# Patient Record
Sex: Female | Born: 1955 | Race: White | Hispanic: No | Marital: Single | Smoking: Current every day smoker
Health system: Southern US, Community
[De-identification: ages and names within clinical notes are randomized; demographics above are authoritative.]

## PROBLEM LIST (undated history)

## (undated) DIAGNOSIS — F329 Major depressive disorder, single episode, unspecified: Secondary | ICD-10-CM

## (undated) DIAGNOSIS — I10 Essential (primary) hypertension: Secondary | ICD-10-CM

## (undated) DIAGNOSIS — M545 Low back pain, unspecified: Secondary | ICD-10-CM

## (undated) DIAGNOSIS — K589 Irritable bowel syndrome without diarrhea: Secondary | ICD-10-CM

## (undated) DIAGNOSIS — K219 Gastro-esophageal reflux disease without esophagitis: Secondary | ICD-10-CM

## (undated) DIAGNOSIS — E785 Hyperlipidemia, unspecified: Secondary | ICD-10-CM

## (undated) DIAGNOSIS — M542 Cervicalgia: Secondary | ICD-10-CM

## (undated) DIAGNOSIS — G8929 Other chronic pain: Secondary | ICD-10-CM

## (undated) DIAGNOSIS — G43909 Migraine, unspecified, not intractable, without status migrainosus: Secondary | ICD-10-CM

## (undated) DIAGNOSIS — F419 Anxiety disorder, unspecified: Secondary | ICD-10-CM

## (undated) DIAGNOSIS — F319 Bipolar disorder, unspecified: Secondary | ICD-10-CM

## (undated) DIAGNOSIS — F32A Depression, unspecified: Secondary | ICD-10-CM

## (undated) HISTORY — DX: Gastro-esophageal reflux disease without esophagitis: K21.9

## (undated) HISTORY — PX: TOTAL ABDOMINAL HYSTERECTOMY W/ BILATERAL SALPINGOOPHORECTOMY: SHX83

## (undated) HISTORY — DX: Low back pain, unspecified: M54.50

## (undated) HISTORY — PX: ABDOMINAL HYSTERECTOMY: SHX81

## (undated) HISTORY — DX: Irritable bowel syndrome, unspecified: K58.9

## (undated) HISTORY — DX: Hyperlipidemia, unspecified: E78.5

## (undated) HISTORY — DX: Other chronic pain: G89.29

## (undated) HISTORY — DX: Anxiety disorder, unspecified: F41.9

## (undated) HISTORY — DX: Cervicalgia: M54.2

## (undated) HISTORY — PX: OTHER SURGICAL HISTORY: SHX169

## (undated) HISTORY — DX: Major depressive disorder, single episode, unspecified: F32.9

## (undated) HISTORY — DX: Depression, unspecified: F32.A

## (undated) HISTORY — DX: Essential (primary) hypertension: I10

## (undated) HISTORY — PX: SHOULDER SURGERY: SHX246

## (undated) HISTORY — DX: Low back pain: M54.5

## (undated) HISTORY — PX: BACK SURGERY: SHX140

## (undated) HISTORY — DX: Migraine, unspecified, not intractable, without status migrainosus: G43.909

## (undated) HISTORY — DX: Bipolar disorder, unspecified: F31.9

---

## 1998-05-22 ENCOUNTER — Emergency Department (HOSPITAL_COMMUNITY): Admission: EM | Admit: 1998-05-22 | Discharge: 1998-05-22 | Payer: Self-pay | Admitting: Emergency Medicine

## 1998-08-21 HISTORY — PX: CHOLECYSTECTOMY: SHX55

## 1999-08-22 HISTORY — PX: OTHER SURGICAL HISTORY: SHX169

## 2000-01-13 ENCOUNTER — Encounter: Payer: Self-pay | Admitting: Neurosurgery

## 2000-01-18 ENCOUNTER — Inpatient Hospital Stay (HOSPITAL_COMMUNITY): Admission: RE | Admit: 2000-01-18 | Discharge: 2000-01-19 | Payer: Self-pay | Admitting: Neurosurgery

## 2000-01-18 ENCOUNTER — Encounter: Payer: Self-pay | Admitting: Neurosurgery

## 2000-03-20 ENCOUNTER — Encounter: Admission: RE | Admit: 2000-03-20 | Discharge: 2000-03-20 | Payer: Self-pay | Admitting: Neurosurgery

## 2000-03-20 ENCOUNTER — Encounter: Payer: Self-pay | Admitting: Neurosurgery

## 2000-05-25 ENCOUNTER — Encounter: Admission: RE | Admit: 2000-05-25 | Discharge: 2000-05-25 | Payer: Self-pay | Admitting: Neurosurgery

## 2000-05-25 ENCOUNTER — Encounter: Payer: Self-pay | Admitting: Neurosurgery

## 2001-05-09 ENCOUNTER — Encounter: Payer: Self-pay | Admitting: Emergency Medicine

## 2001-05-09 ENCOUNTER — Emergency Department (HOSPITAL_COMMUNITY): Admission: EM | Admit: 2001-05-09 | Discharge: 2001-05-10 | Payer: Self-pay | Admitting: Emergency Medicine

## 2001-08-02 ENCOUNTER — Emergency Department (HOSPITAL_COMMUNITY): Admission: EM | Admit: 2001-08-02 | Discharge: 2001-08-03 | Payer: Self-pay | Admitting: Emergency Medicine

## 2001-08-02 ENCOUNTER — Emergency Department (HOSPITAL_COMMUNITY): Admission: EM | Admit: 2001-08-02 | Discharge: 2001-08-02 | Payer: Self-pay | Admitting: Emergency Medicine

## 2001-08-03 ENCOUNTER — Encounter: Payer: Self-pay | Admitting: Emergency Medicine

## 2001-08-10 ENCOUNTER — Emergency Department (HOSPITAL_COMMUNITY): Admission: EM | Admit: 2001-08-10 | Discharge: 2001-08-11 | Payer: Self-pay | Admitting: *Deleted

## 2001-09-04 ENCOUNTER — Emergency Department (HOSPITAL_COMMUNITY): Admission: EM | Admit: 2001-09-04 | Discharge: 2001-09-04 | Payer: Self-pay | Admitting: Emergency Medicine

## 2001-09-23 ENCOUNTER — Emergency Department (HOSPITAL_COMMUNITY): Admission: EM | Admit: 2001-09-23 | Discharge: 2001-09-23 | Payer: Self-pay | Admitting: *Deleted

## 2001-11-27 ENCOUNTER — Encounter: Payer: Self-pay | Admitting: Internal Medicine

## 2001-11-27 ENCOUNTER — Ambulatory Visit (HOSPITAL_COMMUNITY): Admission: RE | Admit: 2001-11-27 | Discharge: 2001-11-27 | Payer: Self-pay | Admitting: Internal Medicine

## 2002-06-26 ENCOUNTER — Ambulatory Visit (HOSPITAL_COMMUNITY): Admission: RE | Admit: 2002-06-26 | Discharge: 2002-06-26 | Payer: Self-pay | Admitting: Internal Medicine

## 2002-06-26 ENCOUNTER — Encounter: Payer: Self-pay | Admitting: Internal Medicine

## 2003-01-29 ENCOUNTER — Encounter: Payer: Self-pay | Admitting: *Deleted

## 2003-01-29 ENCOUNTER — Emergency Department (HOSPITAL_COMMUNITY): Admission: EM | Admit: 2003-01-29 | Discharge: 2003-01-29 | Payer: Self-pay | Admitting: *Deleted

## 2003-03-07 ENCOUNTER — Encounter: Payer: Self-pay | Admitting: *Deleted

## 2003-03-07 ENCOUNTER — Emergency Department (HOSPITAL_COMMUNITY): Admission: EM | Admit: 2003-03-07 | Discharge: 2003-03-07 | Payer: Self-pay | Admitting: *Deleted

## 2003-03-08 ENCOUNTER — Ambulatory Visit (HOSPITAL_COMMUNITY): Admission: RE | Admit: 2003-03-08 | Discharge: 2003-03-08 | Payer: Self-pay | Admitting: Emergency Medicine

## 2003-03-08 ENCOUNTER — Encounter: Payer: Self-pay | Admitting: Emergency Medicine

## 2003-03-16 ENCOUNTER — Emergency Department (HOSPITAL_COMMUNITY): Admission: EM | Admit: 2003-03-16 | Discharge: 2003-03-16 | Payer: Self-pay | Admitting: Emergency Medicine

## 2003-03-16 ENCOUNTER — Encounter: Payer: Self-pay | Admitting: *Deleted

## 2003-03-18 ENCOUNTER — Encounter: Payer: Self-pay | Admitting: Orthopaedic Surgery

## 2003-03-18 ENCOUNTER — Ambulatory Visit (HOSPITAL_COMMUNITY): Admission: RE | Admit: 2003-03-18 | Discharge: 2003-03-18 | Payer: Self-pay | Admitting: Orthopaedic Surgery

## 2003-05-12 ENCOUNTER — Ambulatory Visit (HOSPITAL_COMMUNITY): Admission: RE | Admit: 2003-05-12 | Discharge: 2003-05-12 | Payer: Self-pay | Admitting: Orthopaedic Surgery

## 2003-05-12 ENCOUNTER — Encounter: Payer: Self-pay | Admitting: Orthopaedic Surgery

## 2003-06-18 ENCOUNTER — Emergency Department (HOSPITAL_COMMUNITY): Admission: EM | Admit: 2003-06-18 | Discharge: 2003-06-18 | Payer: Self-pay | Admitting: *Deleted

## 2003-08-13 ENCOUNTER — Emergency Department (HOSPITAL_COMMUNITY): Admission: EM | Admit: 2003-08-13 | Discharge: 2003-08-13 | Payer: Self-pay | Admitting: Emergency Medicine

## 2004-02-03 ENCOUNTER — Emergency Department (HOSPITAL_COMMUNITY): Admission: EM | Admit: 2004-02-03 | Discharge: 2004-02-03 | Payer: Self-pay | Admitting: Emergency Medicine

## 2004-02-22 ENCOUNTER — Emergency Department (HOSPITAL_COMMUNITY): Admission: EM | Admit: 2004-02-22 | Discharge: 2004-02-22 | Payer: Self-pay | Admitting: *Deleted

## 2005-05-15 ENCOUNTER — Emergency Department (HOSPITAL_COMMUNITY): Admission: EM | Admit: 2005-05-15 | Discharge: 2005-05-15 | Payer: Self-pay | Admitting: Emergency Medicine

## 2006-02-14 ENCOUNTER — Emergency Department (HOSPITAL_COMMUNITY): Admission: EM | Admit: 2006-02-14 | Discharge: 2006-02-14 | Payer: Self-pay | Admitting: Emergency Medicine

## 2006-06-22 ENCOUNTER — Emergency Department (HOSPITAL_COMMUNITY): Admission: EM | Admit: 2006-06-22 | Discharge: 2006-06-22 | Payer: Self-pay | Admitting: Emergency Medicine

## 2007-03-01 ENCOUNTER — Encounter (INDEPENDENT_AMBULATORY_CARE_PROVIDER_SITE_OTHER): Payer: Self-pay | Admitting: Internal Medicine

## 2007-03-01 ENCOUNTER — Emergency Department (HOSPITAL_COMMUNITY): Admission: EM | Admit: 2007-03-01 | Discharge: 2007-03-01 | Payer: Self-pay | Admitting: Emergency Medicine

## 2007-03-13 ENCOUNTER — Ambulatory Visit: Payer: Self-pay | Admitting: Internal Medicine

## 2007-03-13 DIAGNOSIS — I10 Essential (primary) hypertension: Secondary | ICD-10-CM | POA: Insufficient documentation

## 2007-03-13 DIAGNOSIS — E785 Hyperlipidemia, unspecified: Secondary | ICD-10-CM

## 2007-03-13 DIAGNOSIS — F319 Bipolar disorder, unspecified: Secondary | ICD-10-CM | POA: Insufficient documentation

## 2007-03-13 DIAGNOSIS — F411 Generalized anxiety disorder: Secondary | ICD-10-CM | POA: Insufficient documentation

## 2007-03-13 DIAGNOSIS — F329 Major depressive disorder, single episode, unspecified: Secondary | ICD-10-CM

## 2007-03-13 DIAGNOSIS — M545 Low back pain: Secondary | ICD-10-CM

## 2007-03-13 DIAGNOSIS — K589 Irritable bowel syndrome without diarrhea: Secondary | ICD-10-CM | POA: Insufficient documentation

## 2007-03-13 DIAGNOSIS — K5900 Constipation, unspecified: Secondary | ICD-10-CM | POA: Insufficient documentation

## 2007-03-13 DIAGNOSIS — K219 Gastro-esophageal reflux disease without esophagitis: Secondary | ICD-10-CM

## 2007-03-13 DIAGNOSIS — M542 Cervicalgia: Secondary | ICD-10-CM | POA: Insufficient documentation

## 2007-03-13 DIAGNOSIS — Z87898 Personal history of other specified conditions: Secondary | ICD-10-CM | POA: Insufficient documentation

## 2007-09-06 ENCOUNTER — Ambulatory Visit: Payer: Self-pay | Admitting: Internal Medicine

## 2007-09-07 ENCOUNTER — Encounter (INDEPENDENT_AMBULATORY_CARE_PROVIDER_SITE_OTHER): Payer: Self-pay | Admitting: Internal Medicine

## 2007-09-09 ENCOUNTER — Telehealth (INDEPENDENT_AMBULATORY_CARE_PROVIDER_SITE_OTHER): Payer: Self-pay | Admitting: *Deleted

## 2007-09-09 LAB — CONVERTED CEMR LAB
BUN: 6 mg/dL (ref 6–23)
CO2: 21 meq/L (ref 19–32)
Calcium: 9.5 mg/dL (ref 8.4–10.5)
Chloride: 100 meq/L (ref 96–112)
Creatinine, Ser: 0.64 mg/dL (ref 0.40–1.20)
Sodium: 138 meq/L (ref 135–145)

## 2007-09-12 ENCOUNTER — Telehealth (INDEPENDENT_AMBULATORY_CARE_PROVIDER_SITE_OTHER): Payer: Self-pay | Admitting: Internal Medicine

## 2007-09-13 ENCOUNTER — Ambulatory Visit (HOSPITAL_COMMUNITY): Admission: RE | Admit: 2007-09-13 | Discharge: 2007-09-13 | Payer: Self-pay | Admitting: Internal Medicine

## 2007-09-13 ENCOUNTER — Encounter (INDEPENDENT_AMBULATORY_CARE_PROVIDER_SITE_OTHER): Payer: Self-pay | Admitting: Internal Medicine

## 2007-09-16 ENCOUNTER — Encounter (INDEPENDENT_AMBULATORY_CARE_PROVIDER_SITE_OTHER): Payer: Self-pay | Admitting: Internal Medicine

## 2007-09-25 ENCOUNTER — Ambulatory Visit: Payer: Self-pay | Admitting: Internal Medicine

## 2007-11-21 ENCOUNTER — Ambulatory Visit (HOSPITAL_COMMUNITY): Admission: RE | Admit: 2007-11-21 | Discharge: 2007-11-21 | Payer: Self-pay | Admitting: Neurosurgery

## 2007-12-18 ENCOUNTER — Ambulatory Visit (HOSPITAL_BASED_OUTPATIENT_CLINIC_OR_DEPARTMENT_OTHER): Admission: RE | Admit: 2007-12-18 | Discharge: 2007-12-18 | Payer: Self-pay | Admitting: Orthopedic Surgery

## 2008-08-04 ENCOUNTER — Emergency Department (HOSPITAL_COMMUNITY): Admission: EM | Admit: 2008-08-04 | Discharge: 2008-08-04 | Payer: Self-pay | Admitting: Emergency Medicine

## 2008-11-18 ENCOUNTER — Emergency Department (HOSPITAL_COMMUNITY): Admission: EM | Admit: 2008-11-18 | Discharge: 2008-11-18 | Payer: Self-pay | Admitting: Emergency Medicine

## 2009-01-31 ENCOUNTER — Emergency Department (HOSPITAL_COMMUNITY): Admission: EM | Admit: 2009-01-31 | Discharge: 2009-01-31 | Payer: Self-pay | Admitting: Emergency Medicine

## 2009-02-16 ENCOUNTER — Emergency Department (HOSPITAL_COMMUNITY): Admission: EM | Admit: 2009-02-16 | Discharge: 2009-02-16 | Payer: Self-pay | Admitting: Emergency Medicine

## 2009-05-22 ENCOUNTER — Emergency Department (HOSPITAL_COMMUNITY): Admission: EM | Admit: 2009-05-22 | Discharge: 2009-05-22 | Payer: Self-pay | Admitting: Emergency Medicine

## 2009-07-18 ENCOUNTER — Emergency Department (HOSPITAL_COMMUNITY): Admission: EM | Admit: 2009-07-18 | Discharge: 2009-07-18 | Payer: Self-pay | Admitting: Emergency Medicine

## 2009-11-08 ENCOUNTER — Emergency Department (HOSPITAL_COMMUNITY): Admission: EM | Admit: 2009-11-08 | Discharge: 2009-11-08 | Payer: Self-pay | Admitting: Emergency Medicine

## 2009-11-13 ENCOUNTER — Emergency Department (HOSPITAL_COMMUNITY): Admission: EM | Admit: 2009-11-13 | Discharge: 2009-11-13 | Payer: Self-pay | Admitting: Emergency Medicine

## 2009-11-14 ENCOUNTER — Emergency Department (HOSPITAL_COMMUNITY): Admission: EM | Admit: 2009-11-14 | Discharge: 2009-11-14 | Payer: Self-pay | Admitting: Emergency Medicine

## 2009-11-14 ENCOUNTER — Encounter: Payer: Self-pay | Admitting: Orthopedic Surgery

## 2009-11-29 ENCOUNTER — Emergency Department (HOSPITAL_COMMUNITY): Admission: EM | Admit: 2009-11-29 | Discharge: 2009-11-29 | Payer: Self-pay | Admitting: Emergency Medicine

## 2009-12-10 ENCOUNTER — Emergency Department (HOSPITAL_COMMUNITY)
Admission: EM | Admit: 2009-12-10 | Discharge: 2009-12-10 | Payer: Self-pay | Source: Home / Self Care | Admitting: Emergency Medicine

## 2009-12-27 ENCOUNTER — Emergency Department (HOSPITAL_COMMUNITY): Admission: EM | Admit: 2009-12-27 | Discharge: 2009-12-27 | Payer: Self-pay | Admitting: Emergency Medicine

## 2010-01-07 ENCOUNTER — Ambulatory Visit: Payer: Self-pay | Admitting: Physician Assistant

## 2010-01-07 ENCOUNTER — Telehealth (INDEPENDENT_AMBULATORY_CARE_PROVIDER_SITE_OTHER): Payer: Self-pay | Admitting: *Deleted

## 2010-01-07 DIAGNOSIS — M25519 Pain in unspecified shoulder: Secondary | ICD-10-CM | POA: Insufficient documentation

## 2010-01-09 ENCOUNTER — Encounter: Payer: Self-pay | Admitting: Physician Assistant

## 2010-01-11 ENCOUNTER — Encounter: Payer: Self-pay | Admitting: Physician Assistant

## 2010-01-11 LAB — CONVERTED CEMR LAB
Albumin: 4.3 g/dL (ref 3.5–5.2)
Alkaline Phosphatase: 65 units/L (ref 39–117)
BUN: 4 mg/dL — ABNORMAL LOW (ref 6–23)
CO2: 25 meq/L (ref 19–32)
Chloride: 104 meq/L (ref 96–112)
Eosinophils Relative: 1 % (ref 0–5)
HCT: 42.9 % (ref 36.0–46.0)
MCV: 99.1 fL (ref 78.0–100.0)
Monocytes Absolute: 0.6 10*3/uL (ref 0.1–1.0)
Monocytes Relative: 6 % (ref 3–12)
Neutrophils Relative %: 65 % (ref 43–77)
Platelets: 166 10*3/uL (ref 150–400)
Potassium: 4.6 meq/L (ref 3.5–5.3)
RBC: 4.33 M/uL (ref 3.87–5.11)
Sodium: 138 meq/L (ref 135–145)
TSH: 0.497 microintl units/mL (ref 0.350–4.500)
Total Protein: 7.1 g/dL (ref 6.0–8.3)
WBC: 9.2 10*3/uL (ref 4.0–10.5)

## 2010-03-09 ENCOUNTER — Emergency Department (HOSPITAL_COMMUNITY): Admission: EM | Admit: 2010-03-09 | Discharge: 2010-03-09 | Payer: Self-pay | Admitting: Emergency Medicine

## 2010-03-18 ENCOUNTER — Encounter: Payer: Self-pay | Admitting: Orthopedic Surgery

## 2010-03-18 ENCOUNTER — Ambulatory Visit (HOSPITAL_COMMUNITY): Admission: RE | Admit: 2010-03-18 | Discharge: 2010-03-18 | Payer: Self-pay | Admitting: Family Medicine

## 2010-04-06 ENCOUNTER — Emergency Department (HOSPITAL_COMMUNITY): Admission: EM | Admit: 2010-04-06 | Discharge: 2010-04-06 | Payer: Self-pay | Admitting: Emergency Medicine

## 2010-07-07 ENCOUNTER — Emergency Department (HOSPITAL_COMMUNITY): Admission: EM | Admit: 2010-07-07 | Discharge: 2010-07-07 | Payer: Self-pay | Admitting: Emergency Medicine

## 2010-08-27 ENCOUNTER — Encounter: Payer: Self-pay | Admitting: Orthopedic Surgery

## 2010-09-07 ENCOUNTER — Ambulatory Visit
Admission: RE | Admit: 2010-09-07 | Discharge: 2010-09-07 | Payer: Self-pay | Source: Home / Self Care | Attending: Orthopedic Surgery | Admitting: Orthopedic Surgery

## 2010-09-07 ENCOUNTER — Encounter: Payer: Self-pay | Admitting: Orthopedic Surgery

## 2010-09-07 DIAGNOSIS — IMO0002 Reserved for concepts with insufficient information to code with codable children: Secondary | ICD-10-CM | POA: Insufficient documentation

## 2010-09-08 ENCOUNTER — Encounter: Payer: Self-pay | Admitting: Orthopedic Surgery

## 2010-09-15 ENCOUNTER — Telehealth: Payer: Self-pay | Admitting: *Deleted

## 2010-09-16 ENCOUNTER — Encounter (INDEPENDENT_AMBULATORY_CARE_PROVIDER_SITE_OTHER): Payer: Self-pay | Admitting: *Deleted

## 2010-09-19 ENCOUNTER — Encounter
Admission: RE | Admit: 2010-09-19 | Discharge: 2010-09-19 | Payer: Self-pay | Source: Home / Self Care | Attending: Orthopedic Surgery | Admitting: Orthopedic Surgery

## 2010-09-20 NOTE — Progress Notes (Signed)
Summary: Files Request  Phone Note Outgoing Call   Summary of Call: Please request records from [name] in [place] and Dr. Fuller Canada in [place]. Initial call taken by: Brynda Rim,  Jan 07, 2010 1:36 PM  Follow-up for Phone Call        shelia, can you handle this for me Follow-up by: Armenia Shannon,  Jan 12, 2010 11:09 AM

## 2010-09-20 NOTE — Letter (Signed)
Summary: PT INFORMATION SHEET  PT INFORMATION SHEET   Imported By: Arta Bruce 02/28/2010 09:25:17  _____________________________________________________________________  External Attachment:    Type:   Image     Comment:   External Document

## 2010-09-20 NOTE — Letter (Signed)
Summary: *HSN Results Follow up  [facility]  [address] [place], [place] [address]   Phone: [phone]  Fax: [phone]      01/09/2010   WILLONA PHARISS [address] [place], [place]  [address]   Dear  Ms. Ernestene Kiel,                            ____S.Drinkard,FNP   ____D. Gore,FNP       ____B. McPherson,MD   ____V. Rankins,MD    ____E. Mulberry,MD    ____N. Daphine Deutscher, FNP  ____D. Reche Dixon, MD    ____K. Philipp Deputy, MD    __x__S. Alben Spittle, PA-C     This letter is to inform you that your recent test(s):  _______Pap Smear    __x_____Lab Test     _______X-ray    ___x____ is within acceptable limits  _______ requires a medication change  _______ requires a follow-up lab visit  _______ requires a follow-up visit with your provider   Comments:       _________________________________________________________ If you have any questions, please contact our office                     Sincerely,  [name] PA-C [facility]

## 2010-09-20 NOTE — Assessment & Plan Note (Signed)
Summary: new pt/ first est care/ back/shoulder/neck/knee and hands pai...   Vital Signs:  Patient profile:   55 year old female Height:      65 inches Weight:      125 pounds Temp:     97.8 degrees F oral Pulse rate:   68 / minute Pulse rhythm:   regular Resp:     18 per minute BP sitting:   157 / 110  (left arm)  Vitals Entered By: Armenia Shannon (Jan 07, 2010 12:00 PM) CC: experiencing headache (migraines), patient needs MRI concerning pain Is Patient Diabetic? No Pain Assessment Patient in pain? no       Does patient need assistance? Functional Status Self care Ambulation Normal   Primary Care Provider:  [name], PA-C  CC:  experiencing headache (migraines) and patient needs MRI concerning pain.  History of Present Illness: Here as new patient.  States last seen by pain clinic in [place].  Had trouble getting to appointments and stopped.  Also used to see [name] in [place].  Did not admit to seeing [name] in [place] until asked.  States she has left shoulder surgery 1-2 years ago.  Does not remember a lot about it.  When [place] searched, there is no record.  Comes into office with complaints of pain in multiple sites. Reviewed records from [name], whom she saw previously.  According to the notes, the patient had a hard time relaying her hx at that time as well.  There are multiple notations in the records that information was not confirmed when prior physician's offices were contacted.  There is an indication that she was discharged from [name] practice due to breaching a pain contract.  She mainly focuses on shoulder pain.  She states she had surgery done on the left and has continued to have pain. She now notes pain in the right shoulder x 6-12 mos.  She denies any injury.  She notes pain all the time.  She has not seen anyone or done anything for this.    Allergies (verified): 1)  ! Darvocet  Past History:  Past Medical  History: Anxiety Depression   a.  previously took paxil in past - no meds in years   b.  used to see psychiatry GERD Hyperlipidemia Hypertension Low back pain chronic neck pain IBS with constipation migraines bipolar disorder  Past Surgical History: Cholecystectomy-2000 C5-6 fusion 2001 - [name] states she had left shoulder surgery - can't remember when and no record in [facility] Hysterectomy (TAH/BSO)  Family History: father-deceased Mother - deceased (lung cancer) child-31 child-28  Social History: Divorced Current Smoker-1ppd x30 years Alcohol use-yes-7-9 drinks, 2-4 times per month   a. denies current use Drug use-no Disability - back pain, headaches  Review of Systems      See HPI General:  Denies chills and fever. CV:  Denies chest pain or discomfort and shortness of breath with exertion. Resp:  Denies cough. GI:  Denies bloody stools. Neuro:  Complains of headaches. Psych:  Denies suicidal thoughts/plans.  Physical Exam  General:  alert, well-developed, and well-nourished.   Head:  normocephalic and atraumatic.   Neck:  supple.   Lungs:  normal breath sounds and no wheezes.   Heart:  normal rate, regular rhythm, and no murmur.   Abdomen:  soft, non-tender, and no inguinal hernia.   Msk:  right shoulder: + pain over AC joint + pain with ext and int rotation + pain with empty  can test no deformity or effusion noted  left shoulder: no pain over [facility] joint + pain with ext and int rotation + pain with empty can test no deformity noted  of note, patient would not relax arms to allow passive examination no crepitus noted bilat  Neurologic:  alert & oriented X3 and cranial nerves II-XII intact.   Psych:  normally interactive.     Impression & Recommendations:  Problem # 1:  SHOULDER PAIN, BILATERAL (ICD-719.41)  right > left history is somewhat suspect she does not know who operated on her shoulder and she does not know what pain clinic  she went to she does have 3 x shaped scars on her shoulder that could be from a laparoscopic procedure I will need the name of her surgeon and records before doing much more also, need records from her pain clinic  she keeps requesting MRIs of her knee, hip, shoulder, back, etc.  her exam is limited as she does not allow passive motion she reacts in pain to every test or palpation she does have fairly good ROM  get xrays tx with naproxen may send to PT  after patient left office, was able to find MRI on left shoulder done in 2009 by [name]  it did show some rotator cuff tendinopathy and poss ant and post labral tears she may have had surgery with [name]  of note, did do search on  controlled substances website she has received 3 rx for narcotics this year from the ED at [facility]  review of Debera Lat demonstrates many trips to the ED at [facility] and [facility] she told the ED provider in March that she was to see [name] in [place] to f/u on her left shoulder pain  The following medications were removed from the medication list:    Diclofenac Sodium 75 Mg Tbec (Diclofenac sodium) .Marland Kitchen... 1 by mouth two times a day    Skelaxin 800 Mg Tabs (Metaxalone) .Marland Kitchen... 1 by mouth three times a day Her updated medication list for this problem includes:    Naproxen 500 Mg Tabs (Naproxen) .Marland Kitchen... Take 1 tablet by mouth two times a day with food  as needed for pain  Orders: T-Drug Screen-Urine, (single) [phone]) Diagnostic X-Ray/Fluoroscopy (Diagnostic X-Ray/Flu)  Problem # 2:  NECK PAIN, CHRONIC (ICD-723.1)  MRI in 2009 without significant abnormalities  The following medications were removed from the medication list:    Diclofenac Sodium 75 Mg Tbec (Diclofenac sodium) .Marland Kitchen... 1 by mouth two times a day    Skelaxin 800 Mg Tabs (Metaxalone) .Marland Kitchen... 1 by mouth three times a day Her updated medication list for this problem includes:    Naproxen 500 Mg Tabs (Naproxen) .Marland Kitchen... Take 1 tablet  by mouth two times a day with food  as needed for pain  Orders: T-Drug Screen-Urine, (single) [phone])  Problem # 3:  HYPERTENSION (ICD-401.9)  needs tx start Norvasc 5  Orders: T-Comprehensive Metabolic Panel ([id number]) T-CBC w/Diff ([id number]) T-TSH ([id number])  Her updated medication list for this problem includes:    Amlodipine Besylate 5 Mg Tabs (Amlodipine besylate) .Marland Kitchen... Take 1 tablet by mouth once a day for blood pressure  Problem # 4:  DEPRESSION (ICD-311) I suspect there is more of this contributing here than anything else will eventually try to get her to agree to see LCSW consider PHQ9 at next visit  Problem # 5:  Preventive Health Care (ICD-V70.0)  check some baseline labs and UDS  Orders: T-Comprehensive Metabolic Panel (  [phone]) T-CBC w/Diff [id number]) T-TSH ([phone]) T-Drug Screen-Urine, (single) ([id number])  Complete Medication List: 1)  Amlodipine Besylate 5 Mg Tabs (Amlodipine besylate) .... Take 1 tablet by mouth once a day for blood pressure 2)  Naproxen 500 Mg Tabs (Naproxen) .... Take 1 tablet by mouth two times a day with food  as needed for pain  Patient Instructions: 1)  Take the naproxen two times a day with food as needed for pain. 2)  You can take tylenol in addition to the naproxen if you have pain that breaks through. 3)  Take 650 - 1000 mg of tylenol every 4-6 hours as needed for relief of pain or discomfort of fever. Avoid taking more than 4000 mg in a 24 hour period( can cause liver damage in higher doses).  4)  Start taking Amlodipine for blood pressure. 5)  Please schedule a follow-up appointment in 1 month with Florie Carico for Blood Pressure. 6)  Tobacco is very bad for your health and your loved ones ! You should stop smoking !  7)  Stop smoking tips: Choose a quit date. Cut down before the quit date. Decide what you will do as a substitute when you feel the urge to smoke(gum, toothpick, exercise).  8)  Sign form to  get records from Dr. Lyman Bishop Fusco's office in [place].  9)  Need name of surgeon and pain clinic so we can request records from there as well. Prescriptions: NAPROXEN 500 MG TABS (NAPROXEN) Take 1 tablet by mouth two times a day with food  as needed for pain  #60 x 2   Entered and Authorized by:   [name] PA-C   Signed by:   [name] PA-C on 01/07/2010   Method used:   Print then Give to Patient   RxID:   [id number] AMLODIPINE BESYLATE 5 MG TABS (AMLODIPINE BESYLATE) Take 1 tablet by mouth once a day for blood pressure  #30 x 2   Entered and Authorized by:   [name] PA-C   Signed by:   [name] PA-C on 01/07/2010   Method used:   Print then Give to Patient   RxID:   [id number]    MRI EXAM  Procedure date:  11/21/2007  Findings:      Exam Type: left shoulder MRI Results: IMPRESSION:    1.  Glenohumeral joint degenerative changes.   2.  No significant findings for bony impingement.   3.  Moderate rotator cuff tendinopathy/tendinosis.  Areas of bursal   surface irregularity are noted without discrete tear.   4.  Anterior and posterior labral tears are suspected the superior   labrum is intact.   5.  Intact biceps tendon.

## 2010-09-20 NOTE — Letter (Signed)
Summary: *HSN Results Follow up  [facility]  [address] [place], [place] [address]   Phone: [phone]  Fax: [phone]      01/11/2010   Deanna Clark [address] [place], [place]  [address]   Dear  Ms. Deanna Clark,                            ____S.Drinkard,FNP   ____D. Gore,FNP       ____B. McPherson,MD   ____V. Rankins,MD    ____E. Mulberry,MD    ____N. Daphine Deutscher, FNP  ____D. Reche Dixon, MD    ____K. Philipp Deputy, MD    __x__S. Alben Spittle, PA-C     This letter is to inform you that your recent test(s):  _______Pap Smear    __x_____Lab Test     _______X-ray    ____x___ is within acceptable limits  _______ requires a medication change  ____x___ requires a follow-up lab visit  _______ requires a follow-up visit with your provider   Comments: Urine test needs to be collected again.  Please call to reschedule.       _________________________________________________________ If you have any questions, please contact our office                     Sincerely,  [name] PA-C [facility]

## 2010-09-22 NOTE — Assessment & Plan Note (Signed)
Summary: left knee pain/needs xray/medicaid/frs   Vital Signs:  Patient profile:   55 year old female Height:      65 inches Weight:      135 pounds Pulse rate:   60 / minute Resp:     16 per minute  Vitals Entered By: Fuller Canada MD (September 07, 2010 2:54 PM)  Visit Type:  new patient Referring Provider:  [facility] in [place] Primary Provider:  [facility] in [place]  CC:  left knee pain.  History of Present Illness:   55 year old female referred from [facility], telling us that she fell onto both her knees on December 4.  She took some Flexeril and naproxen as well as 4 mg of ibuprofen and did not improve.  She has a history of being discharged from a local practice for lack of compliance with the pain medication contractor.  She complains of diffuse LEFT knee pain which is sharp constant worse at night and has an intensity of 10 out of 10  Meds: Flexeril and Naproxen for the knee as well as Ibuprofen 400mg  qid.     Allergies: 1)  ! 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 Cardiovascular:  Denies chest pain, palpitations, fainting, and murmurs. Respiratory:  Denies short of breath, wheezing, couch, tightness, pain on inspiration, and snoring . Gastrointestinal:  Complains of constipation; denies heartburn, nausea, vomiting, diarrhea, and blood in your stools. Genitourinary:  Denies frequency, urgency, difficulty urinating, painful urination, flank pain, and bleeding in urine. Neurologic:  Complains of numbness; denies tingling, unsteady gait, dizziness, tremors, and seizure. Musculoskeletal:  Complains of stiffness and redness; denies joint pain, swelling, instability, heat, and muscle pain. Endocrine:  Denies excessive thirst,  exessive urination, and heat or cold intolerance. Psychiatric:  Complains of depression; denies nervousness, anxiety, and hallucinations. Skin:  Denies changes in the skin, poor healing, rash, itching, and redness. HEENT:  Denies blurred or double vision, eye pain, redness, and watering. Immunology:  Denies seasonal allergies, sinus problems, and allergic to bee stings. Hemoatologic:  Complains of brusing; denies easy bleeding.  Physical Exam  Additional Exam:  small frame stable vital signs no deformities  Normal pulse and perfusion bilateral lower extremities  Small laceration closed old LEFT lateral knee.  Skin otherwise intact without rash  Normal sensation both lower extremities  Psychiatric exam she is awake and alert she is oriented x3 her mood is anxious.  She is obviously exacerbating her symptoms.  She ambulated with an altered gait.  She has an abrasion over the RIGHT knee with full passive range of motion.  Strength is normal.  Her knee is stable.  LEFT knee is difficult to examine she didn't want to bend the knee until I told her that I would not be able to see her or examine her without bend She had giving way weakness.  Once I did give her any upper flexion at 90 she was noted to have no joint effusion but diffuse tenderness globally about the knee.  No swelling is seen.  The knee seems stable but the exam was inconclusive an unreliable.  Muscle tone was normal   Impression & Recommendations:  Problem # 1:  TEAR MEDIAL MENISCUS (ICD-836.0)  This is most likely an attempt to get medication however since my exam is unreliable based on her  guarding and posturing recommend MRI to rule out medial meniscal tear.  Most likely patellar contusion at most.  I will call her with the results if they're normal then she can continue with the hinged knee brace until her pain goes away.  It if it is abnormal then I would treat it.  Orders: New Patient Level III ([address]) Knee  x-ray,  3 views ([address])  Patient Instructions: 1)  NO FOLLOW UP NEEDED  2)  GET BRACE FROM [facility]  3)  MRI LEFT KNEE (call results)   Orders Added: 1)  New Patient Level III [09811] 2)  Knee x-ray,  3 views [73562]

## 2010-09-22 NOTE — Miscellaneous (Signed)
Summary: mri appt [facility] for 09/19/10 6:45pm  Clinical Lists Changes    lmom for pt about mri , dr h to call with results.

## 2010-09-22 NOTE — Letter (Signed)
Summary: History Form   History Form   Imported By: Eugenio Hoes 09/13/2010 09:57:39  _____________________________________________________________________  External Attachment:    Type:   Image     Comment:   External Document

## 2010-09-22 NOTE — Letter (Signed)
Summary: *Orthopedic Consult Note  [facility]  [address] [place] [address]  [place], [place] [address]   Phone: [phone]  Fax: [phone]    Re:    Deanna Clark DOB:    1956-01-30   Dear: Dr. Gwinda Passe   Thank you for requesting that we see the above patient for consultation.  A copy of the detailed office note will be sent under separate cover, for your review.  Evaluation today is consistent with: Feigning injury but we will rule out meniscal tear with MRI 1)  TEAR MEDIAL MENISCUS (ICD-836.0)   Our recommendation is for: MRI, Knee brace        Thank you for this opportunity to look after your patient.  Sincerely,   Terrance Mass. MD.

## 2010-09-22 NOTE — Letter (Signed)
Summary: Generic Letter for jury duty   [facility]  [address] [place] [address]  [place], [place] [address]   Phone: [phone]  Fax: [phone]     09/08/2010    Deanna Clark  DOB August 08, 2056   Dear [name] and To Whom This May Concern:  Due to medical reasons and current medical treatment, the above named patient will be unable to perform jury duty as requested per attached letter, for start date of September 12, 2010.     Sincerely,    Terrance Mass, MD

## 2010-11-09 NOTE — Telephone Encounter (Signed)
Advised patient

## 2011-01-06 NOTE — Op Note (Signed)
[place]  Patient:    NOGA, FOGG                     MRN: [id number] Proc. Date: 01/18/00 Adm. Date:  [id number] Disc. Date: [id number] Attending:  Barton Fanny CC:         Hewitt Shorts, M.D.                           Operative Report  PREOPERATIVE DIAGNOSIS:  C5-6 degenerative disk disease and spondylosis.  POSTOPERATIVE DIAGNOSIS:  C5-6 degenerative disk disease and spondylosis.  OPERATION PERFORMED:  C5-6 anterior cervical diskectomy and arthrodesis with iliac crest allograft and Synthes cervical plating.  SURGEON:  Hewitt Shorts, M.D.  ASSISTANT:  Payton Doughty, M.D.  ANESTHESIA:  General endotracheal.  INDICATIONS FOR PROCEDURE:  The patient is a 55 year old woman who presented with neck, shoulder and right upper extremity pain who was found to have significant degenerative disk disease and spondylosis at C5-6 level and decision was made to proceed with a single level anterior cervical diskectomy and arthrodesis.  DESCRIPTION OF PROCEDURE:  The patient was brought to the operating room and placed under general endotracheal anesthesia.  The patient was placed in 10 pounds of halter traction and the neck was prepped with Betadine soap and solution and draped in sterile fashion.  A horizontal incision was made in the left side of the neck.  The line of incision was infiltrated with local anesthetic with epinephrine.  The incison was made with a Shaw scalpel at a temperature of 120.  Dissection was carried down to the subcutaneous tissues and platysma.  Dissection was then carried out to an avascular plane leaving the sternocleidomastoids, carotid artery and jugular vein laterally and trachea and esophagus medially.  The ventral aspect of the vertebral column was identified and a localizing x-ray taken and the C5-6 intervertebral disk space identified.  Diskectomy was begun by incision of the annulus and continued  with a variety of microcurets and pituitary rongeurs.  The cartilaginous end plates of the corresponding vertebrae were removed using microcurets as well as the Midas Rex drill with an A-2 bur.  The microscope was draped and brought into the field to provide additional magnification, illumination and visualization.  The remainder of the procedure was performed using microdissection technique.  Posterior osteophytic overgrowth was removed using the Midas Rex drill with an A-2 bur and a 2 mm Kerrison punch with a thin foot plate.  The posterior longitudinal ligament was thickened and this was removed as well.  Foraminotomies were performed bilaterally and the posterior aspect of the uncinate processes were removed and good decompression was achieved of not only the thecal sac but of the nerve roots within the foramina bilaterally.  Once decompression was completed and hemostasis was established using Gelfoam soaked in thrombin, we proceeded with the arthrodesis.  We selected a wedge of iliac crest allograft.  It was cut and shaped using an oscillating saw and then graft was placed in the intervertebral disk space and countersunk.  We then removed anterior osteophytic overgrowth and selected a 16 mm Synthes plate.  It was secured with 4.0 x 14 mm cancellous self-drilling screws.  Each screw hole was initially started with an awl and then screw was used and secured.  They were placed in a fashion starting with opposite corners and then securing all four screws down tightly.  Once all  four screws were secured and the plate was lying flat against the vertebral column, locking screws were placed and an x-ray was taken.  The plate and screws were in good position, the graft was in good position.  The overall alignment was good.  We then irrigated the wound with bacitracin solution and checked for hemostasis which was established and confirmed and proceeded with closure.  The platysma was closed  with interrupted inverted 2-0 undyed Vicryl sutures, the subcutaneous and subcuticular layer were closed with interrupted inverted 3-0 undyed Vicryl sutures and the skin incisions were approximated with Dermabond.  The patient tolerated the procedure well.  Estimated blood loss for this procedure was 100 cc.  Sponge, needle and instrument counts were correct.  Following surgery, the patient was placed in a soft cervical collar to be reversed from the anesthetic, extubated and transferred to the recovery room for further care. DD:  01/18/00 TD:  01/20/00 Job: [id number]

## 2011-01-06 NOTE — H&P (Signed)
[facility]  Patient:    Deanna Clark, Deanna Clark                     MRN: [id number] Adm. Date:  [id number] Attending:  Barton Fanny                         History and Physical  HISTORY OF PRESENT ILLNESS:   The patient is a 55 year old right handed white female who was evaluated for degenerative changes in the cervical spine associated with pain and discomfort.  She has been having difficulty for many years and gradually worsening for at least the last five or more years.  She complains of pain in her neck which extends to the shoulders, bilaterally worse to the right than to the left, as well as is the upper back with discomfort and pain to the right arm and forearm.  She does not describe any specific weakness or any specific numbness or paresthesias.  The patient was studied with x-rays and MRI scan of the cervical and thoracic spine.  Cervical spine x-rays showed degenerative disc disease and spurring at C5-6 confirmed by MRI scan which shows degenerative disc disease and spondylosis at C5-6 level without significant degeneration at other levels. The thoracic studies did not show any significant degeneration.  The patient is admitted now for a single level C5-6 anterior cervical diskectomy and arthrodesis.  PAST MEDICAL HISTORY:  She denies any history of hypertension, myocardial infarction, cancer, stroke, diabetes, peptic ulcer disease or lung disease. She is status post hysterectomy and cholecystectomy.  ALLERGIES:  She currently has an allergy to codeine.  MEDICATIONS: Tylox, Vicodin and muscle relaxers.  FAMILY HISTORY:  Mother is age 5 and in good health.  Father died at age 33 with emphysema and heart disease.  SOCIAL HISTORY:  The patient does not work. She last worked 12 years ago.  She is really unable to explain why she does not work. She apparently is supported to some extent by her mother.  She is unmarried.  She does  have a son who lives locally.  She smokes a pack a day.  She has been smoking for 20 years. She drinks alcoholic beverages socially.  She denies history of substance abuse.  REVIEW OF SYSTEMS:  Notable for those difficulties described in her history of present illness and past medical history but is otherwise unremarkable.  PHYSICAL EXAMINATION:  GENERAL: The patient is a well-developed, well-nourished, white female who appears older than her stated age and on who one can smell a heavy odor of nicotine.  VITAL SIGNS:  Temperature 97.2, pulse 84, blood pressure 138/96, respiratory rate 20. Height 5 feet 5 inches, weight 125 pounds.  LUNGS:  Clear to auscultation.  She has symmetrical respiratory excursion.  HEART:  Regular rate and rhythm. Normal S1 and S2.  There is no murmur.  ABDOMEN: Soft and nontender.  Bowel sounds are present.  EXTREMITIES:  No clubbing, cyanosis or edema.  MUSCULOSKELETAL:  Shows some mild tenderness to palpation in the mid cervical region.  She has some mild discomfort on range of motion of the neck in all directions.  NEUROLOGIC:  Shows 5/5 strength of the upper extremities although she limits her exertion to some extent due to discomfort. Sensation is intact to pinprick through the upper extremities. Reflexes are 1 to 2 at the biceps, brachioradialis, triceps and quadriceps, 1 at the gastrocnemius and symmetrical bilaterally.  Toes  are downgoing bilaterally.  IMPRESSION:  Patient with chronic neck pain extending into the shoulders, upper back and into the right upper extremities secondary to degenerative disc disease and spondylosis at C5-6.  PLAN:  The patient will be admitted for C5-6 anterior cervical diskectomy and arthrodesis with Allograft and cervical plating.  We discussed alternatives of surgery, the nature of the surgical procedure itself, the uncertainty of clinical improvement with surgery and the significant risk of failure  with arthrodesis with her smoking history.  She was strongly counselled that if she would to proceed with surgery that it would be substantially advantageous to her quit smoking to improve the chance of a successful arthrodesis and successful outcome to surgery.  She has been explained the typical length of surgery, hospital stay and overall recuperation, need for postoperative immobilization in a soft cervical collar and the risks of surgery, including the risks of infection, bleeding, possible need for transfusion, the risk of nerve root dysfunction, pain, weakness, numbness or paresthesias, the risks of spinal cord dysfunction with paralysis of all four limbs, quadriplegia, the risks of failure of the arthrodesis particularly in the light of her significant smoking history and anesthetic risks of myocardial infarction, stroke, pneumonia and death, all of which are significantly increased due to her heavy smoking.  Understanding all of this, she does wish to proceed with surgery. She assured me that she would quit smoking to enhance the chance of a successful outcome.  It has been explained to her though that if she does develop a pseudoarthrosis and failed fusion that she will likely have chronic pain. DD:  01/18/00 TD:  01/18/00 Job: [id number]

## 2011-04-22 IMAGING — CR DG ELBOW COMPLETE 3+V*L*
4 series · 4 of 4 positions shown · non-contrast
Comparison: None.

CLINICAL DATA: Fall

LEFT ELBOW - COMPLETE 3+ VIEW

[x elbow joint ap left]
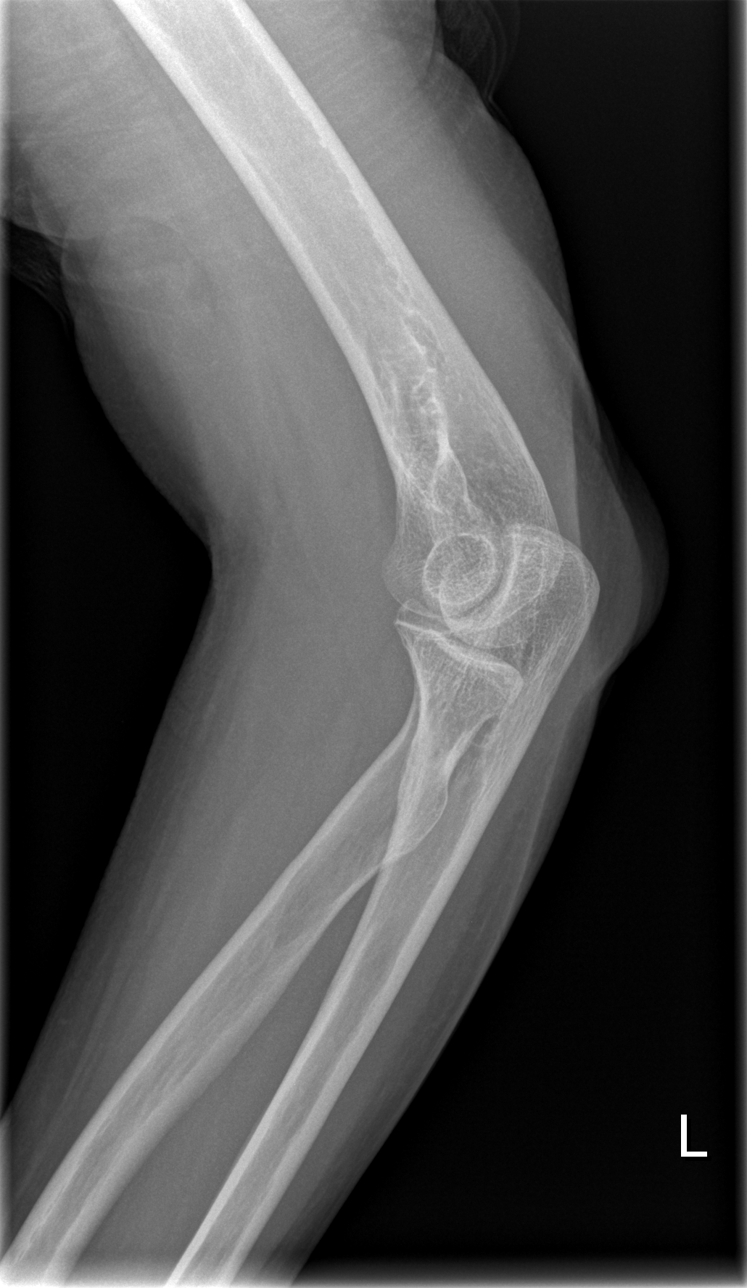

[x elbow joint obl. left (1 of 2)]
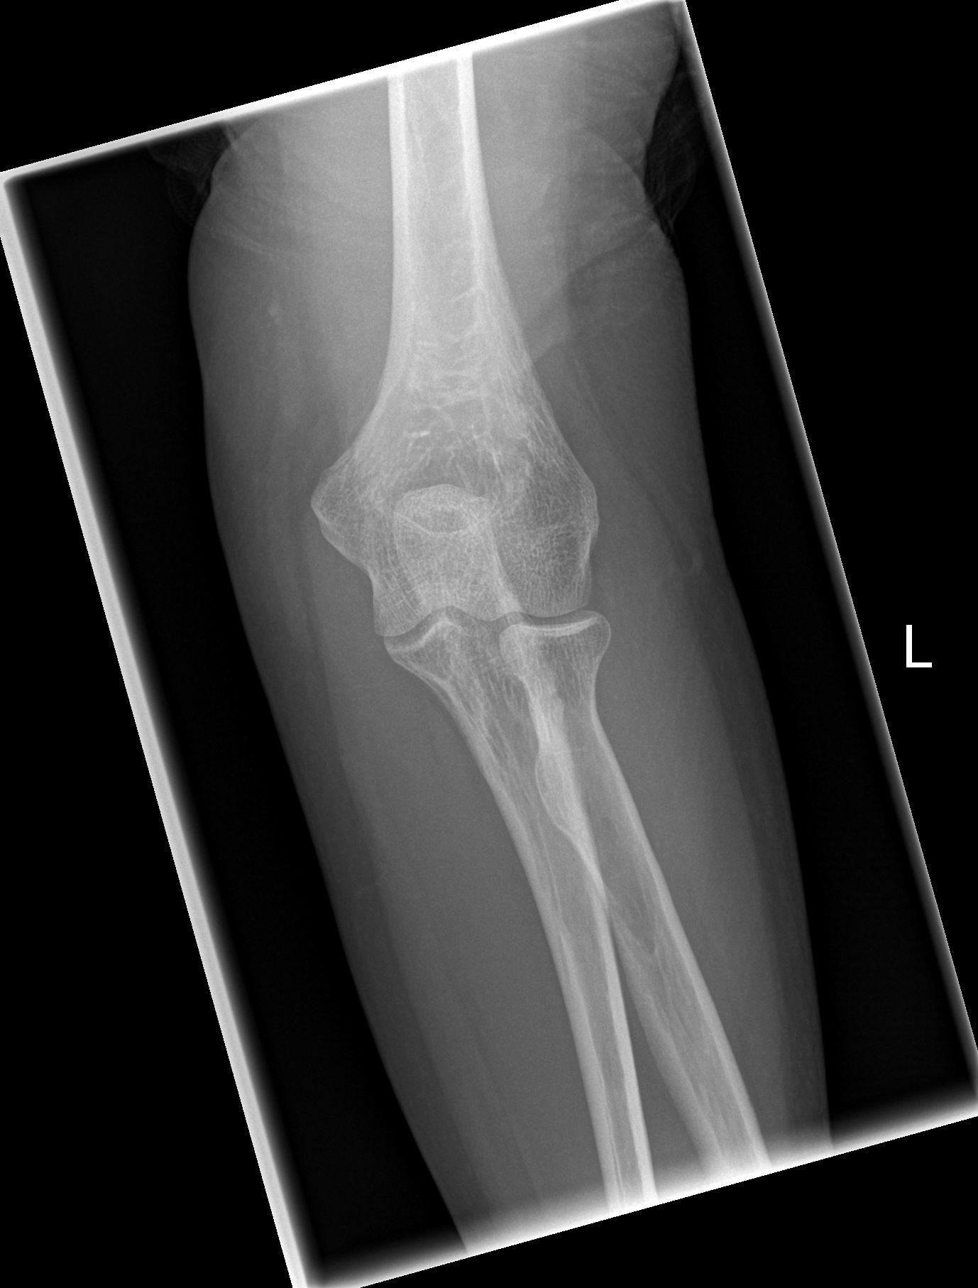

[x elbow joint obl. left (2 of 2)]
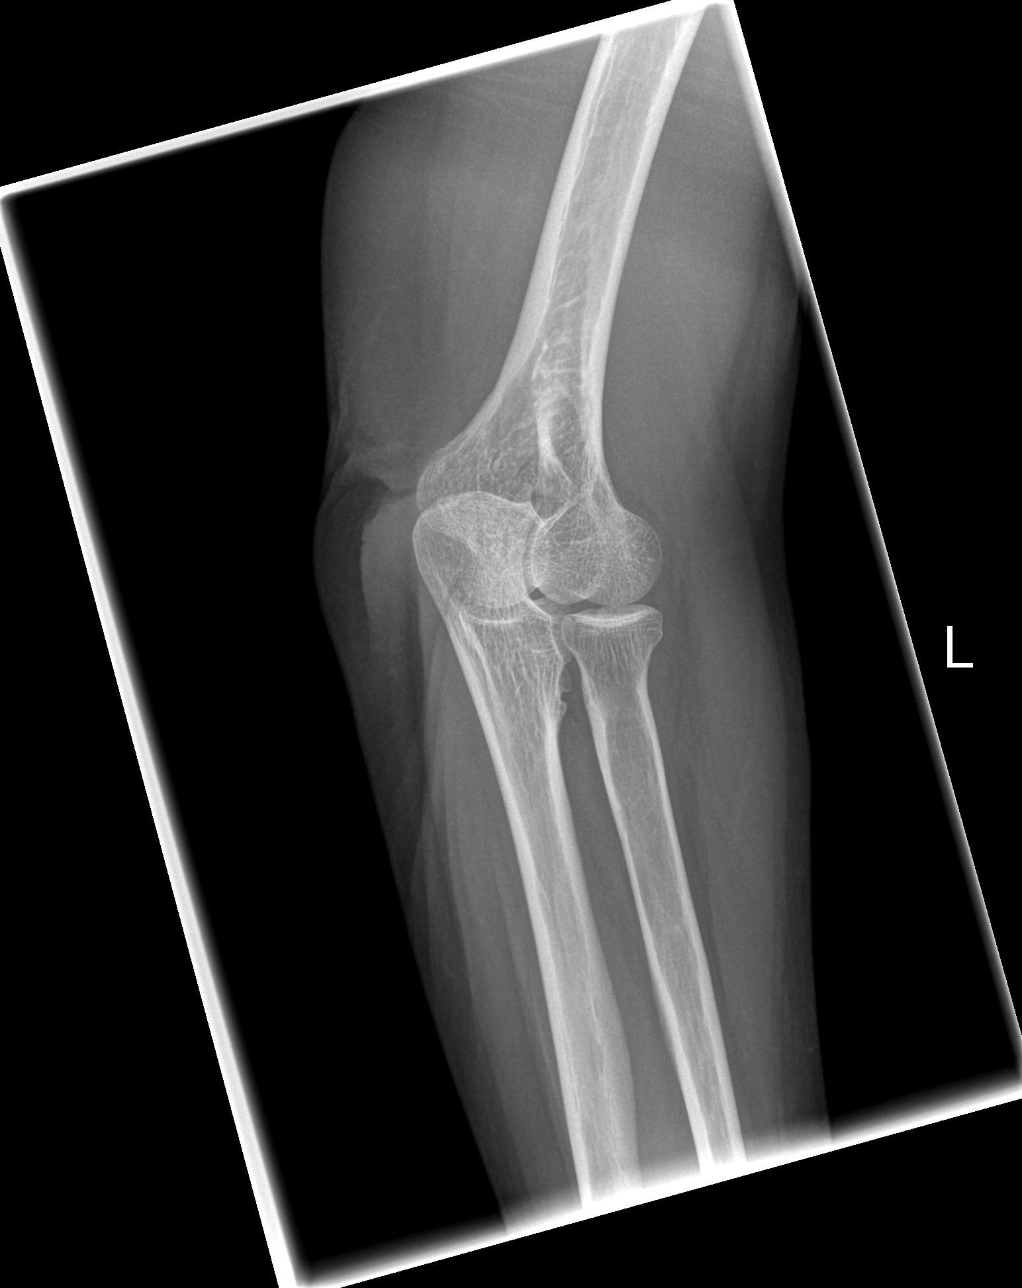

[x elbow joint lat left]
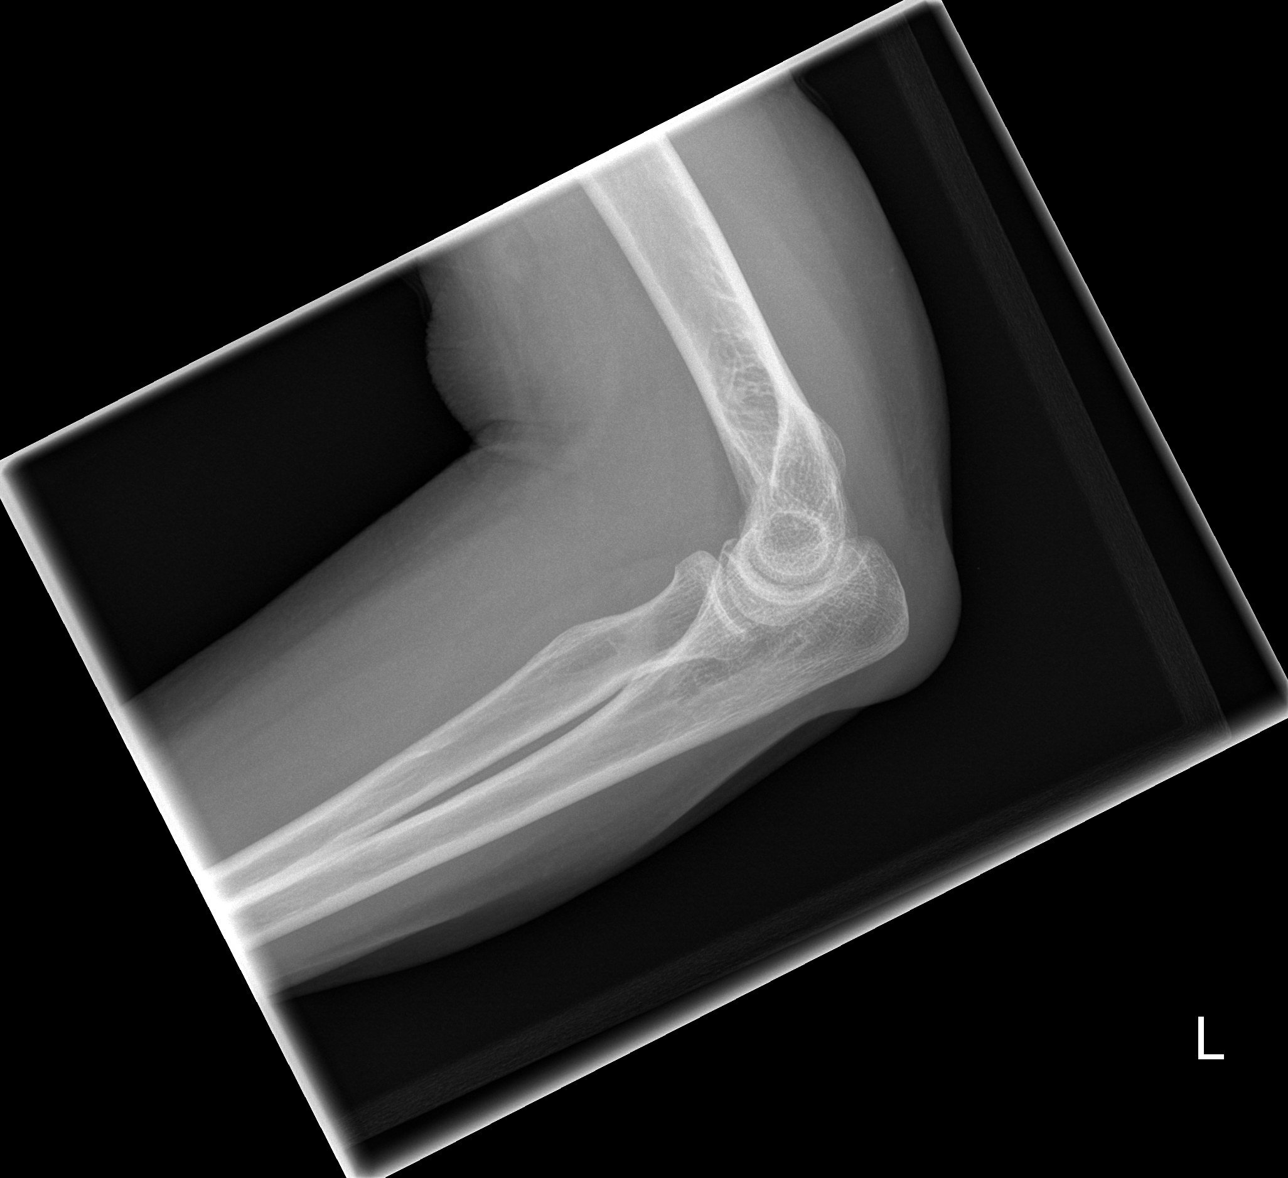

[4 of 4 positions shown; findings below may reference images not displayed]

FINDINGS: No acute fracture or dislocation.  Unremarkable soft
tissues.
IMPRESSION: No acute bony pathology.

## 2011-05-12 ENCOUNTER — Other Ambulatory Visit: Payer: Self-pay | Admitting: Orthopaedic Surgery

## 2011-05-12 DIAGNOSIS — M25511 Pain in right shoulder: Secondary | ICD-10-CM

## 2011-05-16 LAB — BASIC METABOLIC PANEL
BUN: 6
CO2: 26
Calcium: 9
Chloride: 105
GFR calc non Af Amer: >60
Potassium: 3.9
Sodium: 138

## 2011-05-18 ENCOUNTER — Ambulatory Visit
Admission: RE | Admit: 2011-05-18 | Discharge: 2011-05-18 | Disposition: A | Discharge status: Home / Self Care | Payer: Medicaid Other | Source: Ambulatory Visit | Attending: Orthopaedic Surgery | Admitting: Orthopaedic Surgery

## 2011-05-18 DIAGNOSIS — M25511 Pain in right shoulder: Secondary | ICD-10-CM

## 2011-06-06 LAB — BASIC METABOLIC PANEL
Chloride: 105
GFR calc Af Amer: >60
GFR calc non Af Amer: >60
Potassium: 4

## 2011-06-06 LAB — CBC
RBC: 4.44
WBC: 8.6

## 2011-06-06 LAB — DIFFERENTIAL
Lymphocytes Relative: 17
Lymphs Abs: 1.4
Monocytes Relative: 8
Neutro Abs: 6.3
Neutrophils Relative %: 74

## 2011-07-05 ENCOUNTER — Emergency Department (HOSPITAL_COMMUNITY)
Admission: EM | Admit: 2011-07-05 | Discharge: 2011-07-05 | Disposition: A | Discharge status: Home / Self Care | Payer: Medicaid Other | Attending: Emergency Medicine | Admitting: Emergency Medicine

## 2011-07-05 ENCOUNTER — Encounter (HOSPITAL_COMMUNITY): Payer: Self-pay | Admitting: *Deleted

## 2011-07-05 DIAGNOSIS — F17200 Nicotine dependence, unspecified, uncomplicated: Secondary | ICD-10-CM | POA: Insufficient documentation

## 2011-07-05 DIAGNOSIS — F319 Bipolar disorder, unspecified: Secondary | ICD-10-CM | POA: Insufficient documentation

## 2011-07-05 DIAGNOSIS — E785 Hyperlipidemia, unspecified: Secondary | ICD-10-CM | POA: Insufficient documentation

## 2011-07-05 DIAGNOSIS — M25552 Pain in left hip: Secondary | ICD-10-CM

## 2011-07-05 DIAGNOSIS — G8929 Other chronic pain: Secondary | ICD-10-CM | POA: Insufficient documentation

## 2011-07-05 DIAGNOSIS — M542 Cervicalgia: Secondary | ICD-10-CM | POA: Insufficient documentation

## 2011-07-05 DIAGNOSIS — Z9079 Acquired absence of other genital organ(s): Secondary | ICD-10-CM | POA: Insufficient documentation

## 2011-07-05 DIAGNOSIS — R51 Headache: Secondary | ICD-10-CM | POA: Insufficient documentation

## 2011-07-05 DIAGNOSIS — H9209 Otalgia, unspecified ear: Secondary | ICD-10-CM | POA: Insufficient documentation

## 2011-07-05 DIAGNOSIS — M25559 Pain in unspecified hip: Secondary | ICD-10-CM | POA: Insufficient documentation

## 2011-07-05 DIAGNOSIS — K219 Gastro-esophageal reflux disease without esophagitis: Secondary | ICD-10-CM | POA: Insufficient documentation

## 2011-07-05 DIAGNOSIS — Z981 Arthrodesis status: Secondary | ICD-10-CM | POA: Insufficient documentation

## 2011-07-05 DIAGNOSIS — I10 Essential (primary) hypertension: Secondary | ICD-10-CM | POA: Insufficient documentation

## 2011-07-05 DIAGNOSIS — K589 Irritable bowel syndrome without diarrhea: Secondary | ICD-10-CM | POA: Insufficient documentation

## 2011-07-05 MED ORDER — KETOROLAC TROMETHAMINE 30 MG/ML IJ SOLN
30.0000 mg | Freq: Once | INTRAMUSCULAR | Status: AC
  Administered 2011-07-05: 30 mg via INTRAMUSCULAR

## 2011-07-05 MED ORDER — OXYCODONE-ACETAMINOPHEN 5-325 MG PO TABS
1.0000 | ORAL_TABLET | Freq: Once | ORAL | Status: AC
  Administered 2011-07-05: 1 via ORAL
  Filled 2011-07-05: qty 1

## 2011-07-05 MED ORDER — KETOROLAC TROMETHAMINE 30 MG/ML IJ SOLN
30.0000 mg | Freq: Once | INTRAMUSCULAR | Status: DC
  Filled 2011-07-05: qty 1

## 2011-07-05 MED ORDER — ONDANSETRON 8 MG PO TBDP
8.0000 mg | ORAL_TABLET | Freq: Once | ORAL | Status: AC
  Administered 2011-07-05: 8 mg via ORAL
  Filled 2011-07-05: qty 1
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
## 2011-07-05 NOTE — ED Notes (Signed)
Pt provided drink & pack of crackers at this time.

## 2011-07-05 NOTE — ED Notes (Signed)
Pt requesting something for nausea at this time. edp notified.

## 2011-07-05 NOTE — ED Notes (Signed)
Pt c/o pain headache with vomiting and left hip pain

## 2011-07-05 NOTE — ED Notes (Signed)
Pt given discharge instructions, paperwork, pt verbalized understanding.   

## 2011-08-05 ENCOUNTER — Encounter (HOSPITAL_COMMUNITY): Payer: Self-pay | Admitting: *Deleted

## 2011-08-05 ENCOUNTER — Emergency Department (HOSPITAL_COMMUNITY)
Admission: EM | Admit: 2011-08-05 | Discharge: 2011-08-06 | Disposition: A | Discharge status: Home / Self Care | Payer: Medicaid Other | Attending: Emergency Medicine | Admitting: Emergency Medicine

## 2011-08-05 DIAGNOSIS — F411 Generalized anxiety disorder: Secondary | ICD-10-CM | POA: Insufficient documentation

## 2011-08-05 DIAGNOSIS — M542 Cervicalgia: Secondary | ICD-10-CM | POA: Insufficient documentation

## 2011-08-05 DIAGNOSIS — R209 Unspecified disturbances of skin sensation: Secondary | ICD-10-CM | POA: Insufficient documentation

## 2011-08-05 DIAGNOSIS — G8929 Other chronic pain: Secondary | ICD-10-CM | POA: Insufficient documentation

## 2011-08-05 DIAGNOSIS — R05 Cough: Secondary | ICD-10-CM | POA: Insufficient documentation

## 2011-08-05 DIAGNOSIS — F17200 Nicotine dependence, unspecified, uncomplicated: Secondary | ICD-10-CM | POA: Insufficient documentation

## 2011-08-05 DIAGNOSIS — R059 Cough, unspecified: Secondary | ICD-10-CM | POA: Insufficient documentation

## 2011-08-05 DIAGNOSIS — M549 Dorsalgia, unspecified: Secondary | ICD-10-CM | POA: Insufficient documentation

## 2011-08-05 DIAGNOSIS — I10 Essential (primary) hypertension: Secondary | ICD-10-CM | POA: Insufficient documentation

## 2011-08-05 DIAGNOSIS — F410 Panic disorder [episodic paroxysmal anxiety] without agoraphobia: Secondary | ICD-10-CM | POA: Insufficient documentation

## 2011-08-05 DIAGNOSIS — R51 Headache: Secondary | ICD-10-CM | POA: Insufficient documentation

## 2011-08-05 NOTE — ED Provider Notes (Signed)
History     CSN: [id number] Arrival date & time: 08/05/2011 10:54 PM   First MD Initiated Contact with Patient 08/05/11 2259      Chief Complaint  Patient presents with  . Back Pain  . Knee Pain  . Shoulder Pain  . Hypertension    (Consider location/radiation/quality/duration/timing/severity/associated sxs/prior treatment) HPI Comments: Patient presents with multiple complaints,  Stating she became upset tonight when she learned her son will not be home for Christmas.  She developed a headache, started breathing fast,  Followed by her face and fingers becoming numb.  She states this often happens when she gets upset,  And felt her blood pressure was elevated as well.  The symptoms lasted about 20 minutes and she is currently improved,  The episode occurred 6 hours ago.  She is now reporting increased chronic mid back pain.  She has also developed a nonproductive cough and sore throat over past few hours.  She also vomited several times over the past 3 days,  But none today.  She denies nausea at present.  She has found alleviators for her symtpoms.  Stress triggers and/or makes worse.    The history is provided by the patient.    Past Medical History  Diagnosis Date  . Anxiety     use to see Psychiatrist   . Depression     previously took paxil in the past - no meds in years   . GERD (gastroesophageal reflux disease)   . Hyperlipidemia   . Hypertension   . Low back pain   . Chronic neck pain   . IBS (irritable bowel syndrome)     with constipation  . Migraines   . Bipolar disorder     Past Surgical History  Procedure Date  . Cholecystectomy 2000  . C5-6 fusion 2001    [name]  . States she had left shoulder surgery - cant remember when  and no record in [employer]   . Total abdominal hysterectomy w/ bilateral salpingoophorectomy     Family History  Problem Relation Age of Onset  . Cancer Mother     lung     History  Substance Use Topics  . Smoking status:  Current Everyday Smoker -- 1.0 packs/day    Types: Cigarettes  . Smokeless tobacco: Not on file  . Alcohol Use: No    OB History    Grav Para Term Preterm Abortions TAB SAB Ect Mult Living                  Review of Systems  Constitutional: Negative for fever.  HENT: Negative for congestion, sore throat and neck pain.   Eyes: Negative.   Respiratory: Positive for cough. Negative for chest tightness, shortness of breath, wheezing and stridor.   Cardiovascular: Negative for chest pain.  Gastrointestinal: Negative for nausea and abdominal pain.  Genitourinary: Negative.   Musculoskeletal: Negative for joint swelling and arthralgias.  Skin: Negative.  Negative for rash and wound.  Neurological: Positive for numbness and headaches. Negative for dizziness, weakness and light-headedness.  Hematological: Negative.   Psychiatric/Behavioral:       Anxiety     Allergies  Propoxyphene n-acetaminophen  Home Medications   Current Outpatient Rx  Name Route Sig Dispense Refill  . AMLODIPINE BESYLATE PO Oral Take 5 mg by mouth.      Marland Kitchen HYDROCODONE-ACETAMINOPHEN 5-325 MG PO TABS Oral Take 1 tablet by mouth every 6 (six) hours as needed. For pain     .  NAPROXEN 500 MG PO TABS Oral Take 500 mg by mouth 2 (two) times daily with meals. Take 1 tablet by mouth two times a day with food as needed for pain     . OXYCODONE-ACETAMINOPHEN 5-325 MG PO TABS Oral Take 1 tablet by mouth every 4 (four) hours as needed. For pain     . PROMETHAZINE HCL 25 MG PO TABS Oral Take 25 mg by mouth every 6 (six) hours as needed. For nausea       BP 148/104  Pulse 83  Temp(Src) 98.2 F (36.8 C) (Oral)  Resp 20  Ht 5\' 6"  (1.676 m)  Wt 130 lb (58.968 kg)  BMI 20.98 kg/m2  SpO2 95%  Physical Exam  Nursing note and vitals reviewed. Constitutional: She is oriented to person, place, and time. She appears well-developed and well-nourished.       Uncomfortable appearing  HENT:  Head: Normocephalic and  atraumatic.  Mouth/Throat: Oropharynx is clear and moist.  Eyes: Conjunctivae and EOM are normal. Pupils are equal, round, and reactive to light.  Neck: Normal range of motion. Neck supple.  Cardiovascular: Normal rate, regular rhythm, normal heart sounds and intact distal pulses.   Pulmonary/Chest: Effort normal and breath sounds normal. She has no wheezes.  Abdominal: Soft. Bowel sounds are normal. There is no tenderness.  Musculoskeletal: Normal range of motion. She exhibits tenderness. She exhibits no edema.       Thoracic back: She exhibits bony tenderness and pain. She exhibits no swelling, no edema, no deformity and no spasm.  Lymphadenopathy:    She has no cervical adenopathy.  Neurological: She is alert and oriented to person, place, and time. She has normal strength. No cranial nerve deficit or sensory deficit. Coordination and gait normal. GCS eye subscore is 4. GCS verbal subscore is 5. GCS motor subscore is 6.       Normal heel-shin, no pronator drift.  Skin: Skin is warm and dry. No rash noted.  Psychiatric: She has a normal mood and affect. Her speech is normal and behavior is normal. Thought content normal. Cognition and memory are normal.    ED Course  Procedures (including critical care time)  Labs Reviewed - No data to display No results found.   No diagnosis found.    MDM  Patient with anxiety and panic with chronic back pain.  No worrisome sx on exam tonight, normal neuro exam.  Patient does have 9/10 mid back pain,  Worse with palpation.  Will treat with short course of pain medicine.        Candis Musa, PA 08/06/11 0002

## 2011-08-05 NOTE — ED Notes (Addendum)
Pt called EMS for chronic mid back pain, chronic left knee pain, chronic right shoulder pain, and high BP. Reports vomiting x 3 days. Pt now c/o cough and sore throat.

## 2011-08-05 NOTE — ED Notes (Signed)
Patient on phone to obtain ride home. She had requested for ems to take her home.

## 2011-08-06 MED ORDER — HYDROCODONE-ACETAMINOPHEN 5-325 MG PO TABS: 1.0000 | ORAL_TABLET | ORAL | Status: AC | PRN

## 2011-08-06 MED ORDER — HYDROCODONE-ACETAMINOPHEN 5-325 MG PO TABS
1.0000 | ORAL_TABLET | Freq: Once | ORAL | Status: AC
  Administered 2011-08-06: 1 via ORAL
  Filled 2011-08-06: qty 1

## 2011-08-06 NOTE — ED Provider Notes (Signed)
Evaluation and management procedures were performed by the mid-level provider (PA/NP/CNM) under my supervision/collaboration. I was present and available during the ED course. ECG showed no ischemic changes.    Deanna Deupree Y.   Gavin Pound. Oletta Lamas, MD 08/06/11 [phone]

## 2012-02-06 ENCOUNTER — Encounter (HOSPITAL_COMMUNITY): Payer: Self-pay

## 2012-02-06 ENCOUNTER — Emergency Department (HOSPITAL_COMMUNITY)
Admission: EM | Admit: 2012-02-06 | Discharge: 2012-02-06 | Disposition: A | Discharge status: Home / Self Care | Payer: Medicaid Other | Attending: Emergency Medicine | Admitting: Emergency Medicine

## 2012-02-06 DIAGNOSIS — E785 Hyperlipidemia, unspecified: Secondary | ICD-10-CM | POA: Insufficient documentation

## 2012-02-06 DIAGNOSIS — Z9089 Acquired absence of other organs: Secondary | ICD-10-CM | POA: Insufficient documentation

## 2012-02-06 DIAGNOSIS — Z801 Family history of malignant neoplasm of trachea, bronchus and lung: Secondary | ICD-10-CM | POA: Insufficient documentation

## 2012-02-06 DIAGNOSIS — IMO0002 Reserved for concepts with insufficient information to code with codable children: Secondary | ICD-10-CM | POA: Insufficient documentation

## 2012-02-06 DIAGNOSIS — F17200 Nicotine dependence, unspecified, uncomplicated: Secondary | ICD-10-CM | POA: Insufficient documentation

## 2012-02-06 DIAGNOSIS — K219 Gastro-esophageal reflux disease without esophagitis: Secondary | ICD-10-CM | POA: Insufficient documentation

## 2012-02-06 DIAGNOSIS — Z90710 Acquired absence of both cervix and uterus: Secondary | ICD-10-CM | POA: Insufficient documentation

## 2012-02-06 DIAGNOSIS — S335XXA Sprain of ligaments of lumbar spine, initial encounter: Secondary | ICD-10-CM | POA: Insufficient documentation

## 2012-02-06 DIAGNOSIS — F319 Bipolar disorder, unspecified: Secondary | ICD-10-CM | POA: Insufficient documentation

## 2012-02-06 DIAGNOSIS — K589 Irritable bowel syndrome without diarrhea: Secondary | ICD-10-CM | POA: Insufficient documentation

## 2012-02-06 DIAGNOSIS — X58XXXA Exposure to other specified factors, initial encounter: Secondary | ICD-10-CM | POA: Insufficient documentation

## 2012-02-06 DIAGNOSIS — S39012A Strain of muscle, fascia and tendon of lower back, initial encounter: Secondary | ICD-10-CM

## 2012-02-06 DIAGNOSIS — I10 Essential (primary) hypertension: Secondary | ICD-10-CM | POA: Insufficient documentation

## 2012-02-06 DIAGNOSIS — G43909 Migraine, unspecified, not intractable, without status migrainosus: Secondary | ICD-10-CM | POA: Insufficient documentation

## 2012-02-06 DIAGNOSIS — Z981 Arthrodesis status: Secondary | ICD-10-CM | POA: Insufficient documentation

## 2012-02-06 MED ORDER — ONDANSETRON 8 MG PO TBDP
8.0000 mg | ORAL_TABLET | Freq: Once | ORAL | Status: AC
  Administered 2012-02-06: 8 mg via ORAL
  Filled 2012-02-06: qty 1

## 2012-02-06 MED ORDER — HYDROMORPHONE HCL PF 1 MG/ML IJ SOLN
1.0000 mg | Freq: Once | INTRAMUSCULAR | Status: AC
  Administered 2012-02-06: 1 mg via INTRAMUSCULAR
  Filled 2012-02-06: qty 1

## 2012-02-06 MED ORDER — HYDROCODONE-ACETAMINOPHEN 5-325 MG PO TABS: 1.0000 | ORAL_TABLET | ORAL | Status: AC | PRN

## 2012-02-06 MED ORDER — METHOCARBAMOL 500 MG PO TABS: 1000.0000 mg | ORAL_TABLET | Freq: Four times a day (QID) | ORAL | Status: AC

## 2012-02-06 MED ORDER — IBUPROFEN 600 MG PO TABS: 600.0000 mg | ORAL_TABLET | Freq: Three times a day (TID) | ORAL | Status: AC | PRN

## 2012-02-06 NOTE — ED Provider Notes (Signed)
Medical screening examination/treatment/procedure(s) were performed by non-physician practitioner and as supervising physician I was immediately available for consultation/collaboration.  Geoffery Lyons, MD 02/06/12 [phone]

## 2012-02-06 NOTE — ED Notes (Signed)
Pt reports history of DDD and says has had pain in middle of back x 2 days.  Denies injury

## 2012-02-06 NOTE — ED Notes (Signed)
Discussed instructions on not driving after receiving pain injections.  Patient ambulatory out of department, waiting on ride.

## 2012-02-06 NOTE — ED Provider Notes (Signed)
History     CSN: [id number]  Arrival date & time 02/06/12  4259   First MD Initiated Contact with Patient 02/06/12 [phone]      Chief Complaint  Patient presents with  . Back Pain    (Consider location/radiation/quality/duration/timing/severity/associated sxs/prior treatment) HPI Comments: Deanna Clark presents a two-day history of mid to lower back pain despite no trauma or falls.  She does have a history of degenerative disc disease and has occasional episodes of similar back pain.  Pain does not radiate and it is sharp and worse with range of motion and movements.  She denies any peripheral weakness or numbness and has had no urinary or bowel retention or incontinence.  She has tried no relievers for her pain other than rest which has not improved her symptoms.  She denies fevers or chills, has no cancer history and no history of IV drug use.  Patient is a 55 y.o. female presenting with back pain. The history is provided by the patient.  Back Pain  Pertinent negatives include no chest pain, no fever, no numbness, no abdominal pain, no dysuria and no weakness.    Past Medical History  Diagnosis Date  . Anxiety     use to see Psychiatrist   . Depression     previously took paxil in the past - no meds in years   . GERD (gastroesophageal reflux disease)   . Hyperlipidemia   . Hypertension   . Low back pain   . Chronic neck pain   . IBS (irritable bowel syndrome)     with constipation  . Migraines   . Bipolar disorder     Past Surgical History  Procedure Date  . Cholecystectomy 2000  . C5-6 fusion 2001    [name]  . States she had left shoulder surgery - cant remember when  and no record in [employer]   . Total abdominal hysterectomy w/ bilateral salpingoophorectomy     Family History  Problem Relation Age of Onset  . Cancer Mother     lung     History  Substance Use Topics  . Smoking status: Current Everyday Smoker -- 1.0 packs/day    Types: Cigarettes    . Smokeless tobacco: Not on file  . Alcohol Use: No    OB History    Grav Para Term Preterm Abortions TAB SAB Ect Mult Living                  Review of Systems  Constitutional: Negative for fever.  Respiratory: Negative for shortness of breath.   Cardiovascular: Negative for chest pain and leg swelling.  Gastrointestinal: Negative for abdominal pain, constipation and abdominal distention.  Genitourinary: Negative for dysuria, urgency, frequency, flank pain and difficulty urinating.  Musculoskeletal: Positive for back pain. Negative for joint swelling and gait problem.  Skin: Negative for rash.  Neurological: Negative for weakness and numbness.    Allergies  Propoxyphene-acetaminophen  Home Medications   Current Outpatient Rx  Name Route Sig Dispense Refill  . AMLODIPINE BESYLATE PO Oral Take 5 mg by mouth.      Marland Kitchen HYDROCODONE-ACETAMINOPHEN 5-325 MG PO TABS Oral Take 1 tablet by mouth every 6 (six) hours as needed. For pain     . HYDROCODONE-ACETAMINOPHEN 5-325 MG PO TABS Oral Take 1 tablet by mouth every 4 (four) hours as needed for pain. 15 tablet 0  . IBUPROFEN 600 MG PO TABS Oral Take 1 tablet (600 mg total) by mouth every  8 (eight) hours as needed for pain. 20 tablet 0  . METHOCARBAMOL 500 MG PO TABS Oral Take 2 tablets (1,000 mg total) by mouth 4 (four) times daily. 40 tablet 0  . NAPROXEN 500 MG PO TABS Oral Take 500 mg by mouth 2 (two) times daily with meals. Take 1 tablet by mouth two times a day with food as needed for pain     . OXYCODONE-ACETAMINOPHEN 5-325 MG PO TABS Oral Take 1 tablet by mouth every 4 (four) hours as needed. For pain     . PROMETHAZINE HCL 25 MG PO TABS Oral Take 25 mg by mouth every 6 (six) hours as needed. For nausea       BP 154/99  Pulse 77  Temp 97.8 F (36.6 C) (Oral)  Resp 18  Ht 5\' 5"  (1.651 m)  Wt 120 lb (54.432 kg)  BMI 19.97 kg/m2  SpO2 99%  Physical Exam  Nursing note and vitals reviewed. Constitutional: She appears  well-developed and well-nourished.  HENT:  Head: Normocephalic.  Eyes: Conjunctivae are normal.  Neck: Normal range of motion. Neck supple.  Cardiovascular: Normal rate and intact distal pulses.        Pedal pulses normal.  Pulmonary/Chest: Effort normal.  Abdominal: Soft. Bowel sounds are normal. She exhibits no distension and no mass.  Musculoskeletal: Normal range of motion. She exhibits tenderness. She exhibits no edema.       Lumbar back: She exhibits tenderness. She exhibits no swelling, no edema and no spasm.       Bilateral paralumbar tenderness to palpation, no midline tenderness.  Neurological: She is alert. She has normal strength. She displays no atrophy and no tremor. No sensory deficit. Gait normal.  Reflex Scores:      Patellar reflexes are 2+ on the right side and 2+ on the left side.      Achilles reflexes are 2+ on the right side and 2+ on the left side.      No strength deficit noted in hip and knee flexor and extensor muscle groups.  Ankle flexion and extension intact.  Skin: Skin is warm and dry.  Psychiatric: She has a normal mood and affect.    ED Course  Procedures (including critical care time)  Labs Reviewed - No data to display No results found.   1. Strain of lumbar paraspinal muscle     Patient given Dilaudid 1 mg IM and Zofran 8 mg ODT.  MDM  Acute on chronic low back pain without radiculopathy.  Patient was prescribed hydrocodone, ibuprofen and Robaxin.  Recommended rest and heat therapy.  Recheck if not improved over the next 4-5 days. The patient appears reasonably screened and/or stabilized for discharge and I doubt any other medical condition or other [facility] requiring further screening, evaluation, or treatment in the ED at this time prior to discharge.         Burgess Amor, [place] 02/06/12 [phone]

## 2012-02-06 NOTE — Discharge Instructions (Signed)
Back Pain, Adult Low back pain is very common. About 1 in 5 people have back pain.The cause of low back pain is rarely dangerous. The pain often gets better over time.About half of people with a sudden onset of back pain feel better in just 2 weeks. About 8 in 10 people feel better by 6 weeks.  CAUSES Some common causes of back pain include:  Strain of the muscles or ligaments supporting the spine.   Wear and tear (degeneration) of the spinal discs.   Arthritis.   Direct injury to the back.  DIAGNOSIS Most of the time, the direct cause of low back pain is not known.However, back pain can be treated effectively even when the exact cause of the pain is unknown.Answering your caregiver's questions about your overall health and symptoms is one of the most accurate ways to make sure the cause of your pain is not dangerous. If your caregiver needs more information, he or she may order lab work or imaging tests (X-rays or MRIs).However, even if imaging tests show changes in your back, this usually does not require surgery. HOME CARE INSTRUCTIONS For many people, back pain returns.Since low back pain is rarely dangerous, it is often a condition that people can learn to manageon their own.   Remain active. It is stressful on the back to sit or stand in one place. Do not sit, drive, or stand in one place for more than 30 minutes at a time. Take short walks on level surfaces as soon as pain allows.Try to increase the length of time you walk each day.   Do not stay in bed.Resting more than 1 or 2 days can delay your recovery.   Do not avoid exercise or work.Your body is made to move.It is not dangerous to be active, even though your back may hurt.Your back will likely heal faster if you return to being active before your pain is gone.   Pay attention to your body when you bend and lift. Many people have less discomfortwhen lifting if they bend their knees, keep the load close to their  bodies,and avoid twisting. Often, the most comfortable positions are those that put less stress on your recovering back.   Find a comfortable position to sleep. Use a firm mattress and lie on your side with your knees slightly bent. If you lie on your back, put a pillow under your knees.   Only take over-the-counter or prescription medicines as directed by your caregiver. Over-the-counter medicines to reduce pain and inflammation are often the most helpful.Your caregiver may prescribe muscle relaxant drugs.These medicines help dull your pain so you can more quickly return to your normal activities and healthy exercise.   Put ice on the injured area.   Put ice in a plastic bag.   Place a towel between your skin and the bag.   Leave the ice on for 15 to 20 minutes, 3 to 4 times a day for the first 2 to 3 days. After that, ice and heat may be alternated to reduce pain and spasms.   Ask your caregiver about trying back exercises and gentle massage. This may be of some benefit.   Avoid feeling anxious or stressed.Stress increases muscle tension and can worsen back pain.It is important to recognize when you are anxious or stressed and learn ways to manage it.Exercise is a great option.  SEEK MEDICAL CARE IF:  You have pain that is not relieved with rest or medicine.   You have   pain that does not improve in 1 week.   You have new symptoms.   You are generally not feeling well.  SEEK IMMEDIATE MEDICAL CARE IF:   You have pain that radiates from your back into your legs.   You develop new bowel or bladder control problems.   You have unusual weakness or numbness in your arms or legs.   You develop nausea or vomiting.   You develop abdominal pain.   You feel faint.  Document Released: 08/07/2005 Document Revised: 07/27/2011 Document Reviewed: 12/26/2010 [employer] Patient Information 2012 [employer]. 

## 2012-03-02 ENCOUNTER — Encounter (HOSPITAL_COMMUNITY): Payer: Self-pay

## 2012-03-02 ENCOUNTER — Emergency Department (HOSPITAL_COMMUNITY)
Admission: EM | Admit: 2012-03-02 | Discharge: 2012-03-02 | Disposition: A | Discharge status: Home / Self Care | Payer: Medicaid Other | Attending: Emergency Medicine | Admitting: Emergency Medicine

## 2012-03-02 DIAGNOSIS — F3130 Bipolar disorder, current episode depressed, mild or moderate severity, unspecified: Secondary | ICD-10-CM | POA: Insufficient documentation

## 2012-03-02 DIAGNOSIS — F17200 Nicotine dependence, unspecified, uncomplicated: Secondary | ICD-10-CM | POA: Insufficient documentation

## 2012-03-02 DIAGNOSIS — R112 Nausea with vomiting, unspecified: Secondary | ICD-10-CM | POA: Insufficient documentation

## 2012-03-02 DIAGNOSIS — H53149 Visual discomfort, unspecified: Secondary | ICD-10-CM | POA: Insufficient documentation

## 2012-03-02 DIAGNOSIS — E785 Hyperlipidemia, unspecified: Secondary | ICD-10-CM | POA: Insufficient documentation

## 2012-03-02 DIAGNOSIS — K219 Gastro-esophageal reflux disease without esophagitis: Secondary | ICD-10-CM | POA: Insufficient documentation

## 2012-03-02 DIAGNOSIS — G43909 Migraine, unspecified, not intractable, without status migrainosus: Secondary | ICD-10-CM

## 2012-03-02 DIAGNOSIS — F411 Generalized anxiety disorder: Secondary | ICD-10-CM | POA: Insufficient documentation

## 2012-03-02 DIAGNOSIS — I10 Essential (primary) hypertension: Secondary | ICD-10-CM | POA: Insufficient documentation

## 2012-03-02 MED ORDER — HYDROMORPHONE HCL PF 1 MG/ML IJ SOLN
1.0000 mg | Freq: Once | INTRAMUSCULAR | Status: AC
  Administered 2012-03-02: 1 mg via INTRAVENOUS
  Filled 2012-03-02: qty 1

## 2012-03-02 MED ORDER — DIPHENHYDRAMINE HCL 50 MG/ML IJ SOLN
25.0000 mg | Freq: Once | INTRAMUSCULAR | Status: AC
  Administered 2012-03-02: 25 mg via INTRAVENOUS
  Filled 2012-03-02: qty 1

## 2012-03-02 MED ORDER — ISOMETHEPTENE-APAP-DICHLORAL 65-325-100 MG PO CAPS: 1.0000 | ORAL_CAPSULE | Freq: Four times a day (QID) | ORAL | Status: DC | PRN

## 2012-03-02 MED ORDER — METOCLOPRAMIDE HCL 5 MG/ML IJ SOLN
10.0000 mg | Freq: Once | INTRAMUSCULAR | Status: AC
  Administered 2012-03-02: 10 mg via INTRAVENOUS
  Filled 2012-03-02: qty 2

## 2012-03-02 NOTE — ED Provider Notes (Signed)
History  This chart was scribed for Deanna Kras, MD by Ladona Ridgel Day. This patient was seen in room APA03/APA03 and the patient's care was started at 1436.  CSN: [id number]  Arrival date & time 03/02/12  1422   First MD Initiated Contact with Patient 03/02/12 1436      Chief Complaint  Patient presents with  . Headache    Patient is a 56 y.o. female presenting with headaches. The history is provided by the patient. No language interpreter was used.  Headache  Associated symptoms include nausea and vomiting.   Deanna Clark is a 56 y.o. female who presents to the Emergency Department with a history of migraines and states for the past two hours she has been having one similar to previous migraines. She states the pain is bilateral around her temporal area. She states nausea, emesis, photophobic as associated symptoms. She denies any numbness, speech issues, weakness and fever at this time. She has not tried taking any medications at home for her migraine yet today. She states that she usually has about three migraines per week and has no PCP to follow up with.    Past Medical History  Diagnosis Date  . Anxiety     use to see Psychiatrist   . Depression     previously took paxil in the past - no meds in years   . GERD (gastroesophageal reflux disease)   . Hyperlipidemia   . Hypertension   . Low back pain   . Chronic neck pain   . IBS (irritable bowel syndrome)     with constipation  . Migraines   . Bipolar disorder     Past Surgical History  Procedure Date  . Cholecystectomy 2000  . C5-6 fusion 2001    [name]  . States she had left shoulder surgery - cant remember when  and no record in [facility]     r shoulder surgery  . Total abdominal hysterectomy w/ bilateral salpingoophorectomy     Family History  Problem Relation Age of Onset  . Cancer Mother     lung     History  Substance Use Topics  . Smoking status: Current Everyday Smoker -- 1.0 packs/day   Types: Cigarettes  . Smokeless tobacco: Not on file  . Alcohol Use: No    OB History    Grav Para Term Preterm Abortions TAB SAB Ect Mult Living                  Review of Systems  Gastrointestinal: Positive for nausea and vomiting.  Neurological: Positive for headaches (Migraine similar to previous episodes. ). Negative for speech difficulty, weakness and numbness.  All other systems reviewed and are negative.    Allergies  Hydrocodone; Ibuprofen; Propoxyphene-acetaminophen; and Robaxin  Home Medications  No current outpatient prescriptions on file.  Triage Vitals: BP 139/81  Pulse 77  Temp 98.1 F (36.7 C)  Resp 22  Ht 5\' 5"  (1.651 m)  Wt 120 lb (54.432 kg)  BMI 19.97 kg/m2  SpO2 100%  Physical Exam  Nursing note and vitals reviewed. Constitutional: She is oriented to person, place, and time. She appears well-developed and well-nourished. No distress.  HENT:  Head: Normocephalic and atraumatic.  Right Ear: External ear normal.  Left Ear: External ear normal.       No sinus ttp  Eyes: Conjunctivae are normal. Right eye exhibits no discharge. Left eye exhibits no discharge. No scleral icterus.  Neck: Neck supple.  No rigidity. No tracheal deviation, no erythema and normal range of motion present.  Cardiovascular: Normal rate, regular rhythm and intact distal pulses.   Pulmonary/Chest: Effort normal and breath sounds normal. No stridor. No respiratory distress. She has no wheezes. She has no rales.  Abdominal: Soft. Bowel sounds are normal. She exhibits no distension. There is no tenderness. There is no rebound and no guarding.  Musculoskeletal: She exhibits no edema and no tenderness.  Neurological: She is alert and oriented to person, place, and time. She has normal strength. She displays no atrophy and no tremor. No cranial nerve deficit or sensory deficit. She exhibits normal muscle tone. She displays no seizure activity. Coordination normal.  Skin: Skin is warm and  dry. No rash noted.  Psychiatric: She has a normal mood and affect.    ED Course  Procedures (including critical care time) DIAGNOSTIC STUDIES: Oxygen Saturation is 100% on room air, normal by my interpretation.    COORDINATION OF CARE: At 245 PM Discussed treatment plan with patient which includes pain medicine. Patient agrees.   Labs Reviewed - No data to display No results found.   No diagnosis found.    MDM  Pt is feeling better after IV medications for migraine headache.    Consistent with her typical migraine headache.  No warning signs to suggest other etiology.  At this time there does not appear to be any evidence of an acute emergency medical condition and the patient appears stable for discharge with appropriate outpatient follow up.  I personally performed the services described in this documentation, which was scribed in my presence.  The recorded information has been reviewed and considered.        Deanna Kras, MD 03/02/12 [phone]

## 2012-03-02 NOTE — ED Notes (Signed)
Pt c/o migraine headache with vomiting since 1230 this afternoon.    Says feels like her usual migraines.

## 2012-03-14 ENCOUNTER — Emergency Department (HOSPITAL_COMMUNITY)
Admission: EM | Admit: 2012-03-14 | Discharge: 2012-03-14 | Disposition: A | Discharge status: Home / Self Care | Payer: Medicaid Other | Attending: Emergency Medicine | Admitting: Emergency Medicine

## 2012-03-14 ENCOUNTER — Emergency Department (HOSPITAL_COMMUNITY): Payer: Medicaid Other

## 2012-03-14 ENCOUNTER — Encounter (HOSPITAL_COMMUNITY): Payer: Self-pay | Admitting: *Deleted

## 2012-03-14 DIAGNOSIS — F341 Dysthymic disorder: Secondary | ICD-10-CM | POA: Insufficient documentation

## 2012-03-14 DIAGNOSIS — K589 Irritable bowel syndrome without diarrhea: Secondary | ICD-10-CM | POA: Insufficient documentation

## 2012-03-14 DIAGNOSIS — E785 Hyperlipidemia, unspecified: Secondary | ICD-10-CM | POA: Insufficient documentation

## 2012-03-14 DIAGNOSIS — I10 Essential (primary) hypertension: Secondary | ICD-10-CM | POA: Insufficient documentation

## 2012-03-14 DIAGNOSIS — F17200 Nicotine dependence, unspecified, uncomplicated: Secondary | ICD-10-CM | POA: Insufficient documentation

## 2012-03-14 DIAGNOSIS — IMO0001 Reserved for inherently not codable concepts without codable children: Secondary | ICD-10-CM | POA: Insufficient documentation

## 2012-03-14 DIAGNOSIS — K219 Gastro-esophageal reflux disease without esophagitis: Secondary | ICD-10-CM | POA: Insufficient documentation

## 2012-03-14 DIAGNOSIS — M7918 Myalgia, other site: Secondary | ICD-10-CM

## 2012-03-14 DIAGNOSIS — E550 Rickets, active: Secondary | ICD-10-CM | POA: Insufficient documentation

## 2012-03-14 LAB — COMPREHENSIVE METABOLIC PANEL
Alkaline Phosphatase: 67 U/L (ref 39–117)
BUN: 12 mg/dL (ref 6–23)
CO2: 24 mEq/L (ref 19–32)
Calcium: 9.1 mg/dL (ref 8.4–10.5)
GFR calc Af Amer: >90 mL/min (ref >90)
GFR calc non Af Amer: >90 mL/min (ref >90)
Glucose, Bld: 92 mg/dL (ref 70–99)
Potassium: 3.8 mEq/L (ref 3.5–5.1)
Total Protein: 7.1 g/dL (ref 6.0–8.3)

## 2012-03-14 LAB — CBC WITH DIFFERENTIAL/PLATELET
Eosinophils Absolute: 0.2 10*3/uL (ref 0.0–0.7)
Eosinophils Relative: 3 % (ref 0–5)
Hemoglobin: 15 g/dL (ref 12.0–15.0)
Lymphocytes Relative: 40 % (ref 12–46)
Lymphs Abs: 3.8 10*3/uL (ref 0.7–4.0)
MCH: 32.8 pg (ref 26.0–34.0)
MCV: 95.9 fL (ref 78.0–100.0)
Monocytes Relative: 6 % (ref 3–12)
RBC: 4.58 MIL/uL (ref 3.87–5.11)

## 2012-03-14 LAB — URINALYSIS, ROUTINE W REFLEX MICROSCOPIC
Bilirubin Urine: NEGATIVE
Ketones, ur: NEGATIVE mg/dL
Leukocytes, UA: NEGATIVE
Nitrite: POSITIVE — AB
Protein, ur: NEGATIVE mg/dL
Urobilinogen, UA: 0.2 mg/dL (ref 0.0–1.0)
pH: 6 (ref 5.0–8.0)

## 2012-03-14 LAB — URINE MICROSCOPIC-ADD ON

## 2012-03-14 MED ORDER — HYDROMORPHONE HCL PF 1 MG/ML IJ SOLN
1.0000 mg | Freq: Once | INTRAMUSCULAR | Status: AC
  Administered 2012-03-14: 1 mg via INTRAVENOUS
  Filled 2012-03-14: qty 1

## 2012-03-14 MED ORDER — ONDANSETRON HCL 4 MG/2ML IJ SOLN
4.0000 mg | Freq: Once | INTRAMUSCULAR | Status: AC
  Administered 2012-03-14: 4 mg via INTRAVENOUS
  Filled 2012-03-14: qty 2

## 2012-03-14 MED ORDER — CYCLOBENZAPRINE HCL 10 MG PO TABS: 10.0000 mg | ORAL_TABLET | Freq: Three times a day (TID) | ORAL | Status: AC | PRN

## 2012-03-14 MED ORDER — SODIUM CHLORIDE 0.9 % IV SOLN
INTRAVENOUS | Status: DC
  Administered 2012-03-14: 17:00:00 via INTRAVENOUS

## 2012-03-14 MED ORDER — HYDROCODONE-ACETAMINOPHEN 5-325 MG PO TABS: 1.0000 | ORAL_TABLET | ORAL | Status: AC | PRN

## 2012-03-14 NOTE — ED Provider Notes (Signed)
History    This chart was scribed for Deanna Human, MD, MD by Smitty Pluck. The patient was seen in room APA01 and the patient's care was started at 4:41PM.   CSN: [id number]  Arrival date & time 03/14/12  1615   First MD Initiated Contact with Patient 03/14/12 1635      Chief Complaint  Patient presents with  . Emesis    (Consider location/radiation/quality/duration/timing/severity/associated sxs/prior treatment) Patient is a 56 y.o. female presenting with vomiting. The history is provided by the patient.  Emesis  Associated symptoms include cough (nonproductive). Pertinent negatives include no chills, no diarrhea and no fever.   Deanna Clark is a 56 y.o. female who presents to the Emergency Department complaining of chronic moderate back pain, moderate left shoulder pain and emesis onset today at 12PM. Symptoms have been constant without radiation. Pt denies taking medication PTA. Denies diarrhea and dysuria. Pt had hysterectomy. Pt has had disk removed in back and shoulder (bilateral) surgery in 2001. She has also had finger surgery 2x due to it being broken. Pt denies having orthopedic doctor. Pt reports having HTN but does not take medication for it. Pt reports that she smokes 1 pack/day of cigarettes. Denies drug usage and drinking alcohol.   Past Medical History  Diagnosis Date  . Anxiety     use to see Psychiatrist   . Depression     previously took paxil in the past - no meds in years   . GERD (gastroesophageal reflux disease)   . Hyperlipidemia   . Hypertension   . Low back pain   . Chronic neck pain   . IBS (irritable bowel syndrome)     with constipation  . Migraines   . Bipolar disorder     Past Surgical History  Procedure Date  . Cholecystectomy 2000  . C5-6 fusion 2001    [name]  . States she had left shoulder surgery - cant remember when  and no record in [facility]     r shoulder surgery  . Total abdominal hysterectomy w/ bilateral  salpingoophorectomy     Family History  Problem Relation Age of Onset  . Cancer Mother     lung     History  Substance Use Topics  . Smoking status: Current Everyday Smoker -- 1.0 packs/day    Types: Cigarettes  . Smokeless tobacco: Not on file  . Alcohol Use: No    OB History    Grav Para Term Preterm Abortions TAB SAB Ect Mult Living                  Review of Systems  Constitutional: Negative for fever and chills.  HENT: Positive for ear pain. Negative for mouth sores.   Respiratory: Positive for cough (nonproductive).   Cardiovascular: Negative for chest pain.  Gastrointestinal: Positive for nausea and vomiting. Negative for diarrhea.  Genitourinary: Negative for dysuria.  Skin: Negative for rash.  Neurological: Negative for syncope (pt reports feeling like she might faint today ).    Allergies  Hydrocodone; Ibuprofen; Propoxyphene-acetaminophen; and Robaxin  Home Medications   Current Outpatient Rx  Name Route Sig Dispense Refill  . ISOMETHEPTENE-APAP-DICHLORAL 65-325-100 MG PO CAPS Oral Take 1 capsule by mouth 4 (four) times daily as needed. 30 capsule 0    BP 132/85  Pulse 76  Temp 97.9 F (36.6 C) (Oral)  Resp 20  Ht 5\' 5"  (1.651 m)  Wt 136 lb 2 oz (61.746 kg)  BMI  22.65 kg/m2  SpO2 100%  Physical Exam  Nursing note and vitals reviewed. Constitutional: She is oriented to person, place, and time. She appears well-developed and well-nourished. No distress.  HENT:  Head: Normocephalic and atraumatic.  Mouth/Throat: Oropharynx is clear and moist.  Eyes: Conjunctivae are normal.  Neck:       Surgical scar on neck from prior cervical disk operation   Pulmonary/Chest: She has rhonchi (posterior).  Abdominal: Soft. She exhibits no distension. There is no tenderness.  Musculoskeletal: She exhibits no edema and no tenderness.  Lymphadenopathy:    She has no cervical adenopathy.  Neurological: She is alert and oriented to person, place, and time.    Skin: Skin is warm and dry.  Psychiatric: She has a normal mood and affect. Her behavior is normal.    ED Course  Procedures (including critical care time) DIAGNOSTIC STUDIES: Oxygen Saturation is 100% on room air, normal by my interpretation.    COORDINATION OF CARE: 4:49PM EDP discusses pt ED treatment with pt  5:00PM EDP ordered medication:  Scheduled Meds:    .  HYDROmorphone (DILAUDID) injection  1 mg Intravenous Once  .  HYDROmorphone (DILAUDID) injection  1 mg Intravenous Once  . ondansetron  4 mg Intravenous Once  . ondansetron  4 mg Intravenous Once   Continuous Infusions:    . sodium chloride 125 mL/hr at 03/14/12 1710   PRN Meds:.    Labs Reviewed  COMPREHENSIVE METABOLIC PANEL - Abnormal; Notable for the following:    Sodium 133 (*)     Total Bilirubin 0.2 (*)     All other components within normal limits  URINALYSIS, ROUTINE W REFLEX MICROSCOPIC - Abnormal; Notable for the following:    Hgb urine dipstick SMALL (*)     Nitrite POSITIVE (*)     All other components within normal limits  URINE MICROSCOPIC-ADD ON - Abnormal; Notable for the following:    Squamous Epithelial / LPF FEW (*)     Bacteria, UA MANY (*)     All other components within normal limits  CBC WITH DIFFERENTIAL   Dg Chest 2 View  03/14/2012  *RADIOLOGY REPORT*  Clinical Data: Cough and vomiting.  CHEST - 2 VIEW  Comparison: None.  Findings: There is evidence of probable COPD.  Minimal scarring versus atelectasis at the right lung base.  No edema, infiltrate, pneumothorax or pleural fluid identified.  Heart size is normal. Bony structures show osteopenia without visible thoracic compression fracture.  IMPRESSION: Scarring at the right lung base.  Probable underlying COPD without acute findings.  Original Report Authenticated By: Reola Calkins, M.D.   Dg Abd 1 View  03/14/2012  *RADIOLOGY REPORT*  Clinical Data: Vomiting.  ABDOMEN - 1 VIEW  Comparison: CT of the abdomen dated  05/15/2005  Findings: No evidence of acute bowel obstruction or significant ileus.  Spine shows mild degenerative changes.  Calcified phleboliths are identified.  IMPRESSION: No acute bowel obstruction.  Original Report Authenticated By: Reola Calkins, M.D.   Dg Shoulder Left  03/14/2012  *RADIOLOGY REPORT*  Clinical Data: Left shoulder pain.  LEFT SHOULDER - 2+ VIEW  Comparison: None.  Findings: No acute fracture or dislocation identified.  The glenohumeral joint shows normal alignment without significant arthropathy.  No bony erosions or lesions are identified.  There is some resorption of the distal clavicle with widening of the [facility] joint. Appearance is suggestive of prior trauma or postsurgical changes.  IMPRESSION: No acute findings.  Resorption of the distal  clavicle with appearance suggesting either prior injury or surgery.  Original Report Authenticated By: Reola Calkins, M.D.   6:03 PM  Date: 03/14/2012  Rate: 62  Rhythm: normal sinus rhythm  QRS Axis: normal  Intervals: normal PQRS:  Left atrial enlargement.  ST/T Wave abnormalities: normal  Conduction Disutrbances:none  Narrative Interpretation: Borderline EKG  Old EKG Reviewed: none available  7:34 PM Results for orders placed during the hospital encounter of 03/14/12  CBC WITH DIFFERENTIAL      Component Value Range   WBC 9.5  4.0 - 10.5 K/uL   RBC 4.58  3.87 - 5.11 MIL/uL   Hemoglobin 15.0  12.0 - 15.0 g/dL   HCT 78.2  95.6 - 21.3 %   MCV 95.9  78.0 - 100.0 fL   MCH 32.8  26.0 - 34.0 pg   MCHC 34.2  30.0 - 36.0 g/dL   RDW 08.6  57.8 - 46.9 %   Platelets 178  150 - 400 K/uL   Neutrophils Relative 51  43 - 77 %   Neutro Abs 4.8  1.7 - 7.7 K/uL   Lymphocytes Relative 40  12 - 46 %   Lymphs Abs 3.8  0.7 - 4.0 K/uL   Monocytes Relative 6  3 - 12 %   Monocytes Absolute 0.6  0.1 - 1.0 K/uL   Eosinophils Relative 3  0 - 5 %   Eosinophils Absolute 0.2  0.0 - 0.7 K/uL   Basophils Relative 0  0 - 1 %   Basophils  Absolute 0.0  0.0 - 0.1 K/uL  COMPREHENSIVE METABOLIC PANEL      Component Value Range   Sodium 133 (*) 135 - 145 mEq/L   Potassium 3.8  3.5 - 5.1 mEq/L   Chloride 100  96 - 112 mEq/L   CO2 24  19 - 32 mEq/L   Glucose, Bld 92  70 - 99 mg/dL   BUN 12  6 - 23 mg/dL   Creatinine, Ser 6.29  0.50 - 1.10 mg/dL   Calcium 9.1  8.4 - 52.8 mg/dL   Total Protein 7.1  6.0 - 8.3 g/dL   Albumin 3.8  3.5 - 5.2 g/dL   AST 15  0 - 37 U/L   ALT 12  0 - 35 U/L   Alkaline Phosphatase 67  39 - 117 U/L   Total Bilirubin 0.2 (*) 0.3 - 1.2 mg/dL   GFR calc non Af Amer >90  >90 mL/min   GFR calc Af Amer >90  >90 mL/min  URINALYSIS, ROUTINE W REFLEX MICROSCOPIC      Component Value Range   Color, Urine YELLOW  YELLOW   APPearance CLEAR  CLEAR   Specific Gravity, Urine 1.010  1.005 - 1.030   pH 6.0  5.0 - 8.0   Glucose, UA NEGATIVE  NEGATIVE mg/dL   Hgb urine dipstick SMALL (*) NEGATIVE   Bilirubin Urine NEGATIVE  NEGATIVE   Ketones, ur NEGATIVE  NEGATIVE mg/dL   Protein, ur NEGATIVE  NEGATIVE mg/dL   Urobilinogen, UA 0.2  0.0 - 1.0 mg/dL   Nitrite POSITIVE (*) NEGATIVE   Leukocytes, UA NEGATIVE  NEGATIVE  URINE MICROSCOPIC-ADD ON      Component Value Range   Squamous Epithelial / LPF FEW (*) RARE   WBC, UA 3-6  <3 WBC/hpf   RBC / HPF 0-2  <3 RBC/hpf   Bacteria, UA MANY (*) RARE   Dg Chest 2 View  03/14/2012  *RADIOLOGY REPORT*  Clinical  Data: Cough and vomiting.  CHEST - 2 VIEW  Comparison: None.  Findings: There is evidence of probable COPD.  Minimal scarring versus atelectasis at the right lung base.  No edema, infiltrate, pneumothorax or pleural fluid identified.  Heart size is normal. Bony structures show osteopenia without visible thoracic compression fracture.  IMPRESSION: Scarring at the right lung base.  Probable underlying COPD without acute findings.  Original Report Authenticated By: Reola Calkins, M.D.   Dg Abd 1 View  03/14/2012  *RADIOLOGY REPORT*  Clinical Data: Vomiting.   ABDOMEN - 1 VIEW  Comparison: CT of the abdomen dated 05/15/2005  Findings: No evidence of acute bowel obstruction or significant ileus.  Spine shows mild degenerative changes.  Calcified phleboliths are identified.  IMPRESSION: No acute bowel obstruction.  Original Report Authenticated By: Reola Calkins, M.D.   Dg Shoulder Left  03/14/2012  *RADIOLOGY REPORT*  Clinical Data: Left shoulder pain.  LEFT SHOULDER - 2+ VIEW  Comparison: None.  Findings: No acute fracture or dislocation identified.  The glenohumeral joint shows normal alignment without significant arthropathy.  No bony erosions or lesions are identified.  There is some resorption of the distal clavicle with widening of the [facility] joint. Appearance is suggestive of prior trauma or postsurgical changes.  IMPRESSION: No acute findings.  Resorption of the distal clavicle with appearance suggesting either prior injury or surgery.  Original Report Authenticated By: Reola Calkins, M.D.    7:45 PM Lab workup is essentially negative.  She is feeling better, no longer vomiting.  Her pain seems musculoskeletal.  Rx hydrocodone-acetaminophen for pain, Flexeril for muscle spasm.  Advised to stop smoking.  No diagnosis found.   I personally performed the services described in this documentation, which was scribed in my presence. The recorded information has been reviewed and considered.  Deanna Clark, M.D.   Carleene Cooper III, MD 03/14/12 1946

## 2012-03-14 NOTE — ED Notes (Signed)
Patient ambulatory to restroom  ?

## 2012-03-14 NOTE — ED Notes (Signed)
Asked patient to obtain a urine sample. Patient unable to void at this time.

## 2012-03-14 NOTE — ED Notes (Signed)
C/o vomiting, back and left shoulder pain

## 2012-03-14 NOTE — ED Notes (Signed)
Pt advised not to drive for the next 12 hours.  Discussed follow up instructions in detail, as well as medication instructions.

## 2012-03-20 ENCOUNTER — Emergency Department (HOSPITAL_COMMUNITY)
Admission: EM | Admit: 2012-03-20 | Discharge: 2012-03-20 | Disposition: A | Discharge status: Home / Self Care | Payer: Medicaid Other | Attending: Emergency Medicine | Admitting: Emergency Medicine

## 2012-03-20 ENCOUNTER — Encounter (HOSPITAL_COMMUNITY): Payer: Self-pay | Admitting: Emergency Medicine

## 2012-03-20 DIAGNOSIS — R51 Headache: Secondary | ICD-10-CM

## 2012-03-20 DIAGNOSIS — G8929 Other chronic pain: Secondary | ICD-10-CM | POA: Insufficient documentation

## 2012-03-20 DIAGNOSIS — F319 Bipolar disorder, unspecified: Secondary | ICD-10-CM | POA: Insufficient documentation

## 2012-03-20 DIAGNOSIS — F17200 Nicotine dependence, unspecified, uncomplicated: Secondary | ICD-10-CM | POA: Insufficient documentation

## 2012-03-20 DIAGNOSIS — M549 Dorsalgia, unspecified: Secondary | ICD-10-CM

## 2012-03-20 DIAGNOSIS — E785 Hyperlipidemia, unspecified: Secondary | ICD-10-CM | POA: Insufficient documentation

## 2012-03-20 DIAGNOSIS — M546 Pain in thoracic spine: Secondary | ICD-10-CM | POA: Insufficient documentation

## 2012-03-20 DIAGNOSIS — K219 Gastro-esophageal reflux disease without esophagitis: Secondary | ICD-10-CM | POA: Insufficient documentation

## 2012-03-20 DIAGNOSIS — I10 Essential (primary) hypertension: Secondary | ICD-10-CM | POA: Insufficient documentation

## 2012-03-20 MED ORDER — DIPHENHYDRAMINE HCL 50 MG/ML IJ SOLN
25.0000 mg | Freq: Once | INTRAMUSCULAR | Status: AC
  Administered 2012-03-20: 25 mg via INTRAVENOUS
  Filled 2012-03-20: qty 1

## 2012-03-20 MED ORDER — METOCLOPRAMIDE HCL 5 MG/ML IJ SOLN
10.0000 mg | Freq: Once | INTRAMUSCULAR | Status: AC
  Administered 2012-03-20: 10 mg via INTRAVENOUS
  Filled 2012-03-20: qty 2

## 2012-03-20 MED ORDER — HYDROMORPHONE HCL PF 1 MG/ML IJ SOLN
1.0000 mg | Freq: Once | INTRAMUSCULAR | Status: AC
  Administered 2012-03-20: 1 mg via INTRAVENOUS
  Filled 2012-03-20: qty 1

## 2012-03-20 MED ORDER — SODIUM CHLORIDE 0.9 % IV BOLUS (SEPSIS)
1000.0000 mL | Freq: Once | INTRAVENOUS | Status: AC
  Administered 2012-03-20: 1000 mL via INTRAVENOUS

## 2012-03-20 NOTE — ED Provider Notes (Signed)
History  This chart was scribed for Deanna Booze, MD by Erskine Emery. This patient was seen in room APA19/APA19 and the patient's care was started at 17:43.   CSN: [id number]  Arrival date & time 03/20/12  1719   First MD Initiated Contact with Patient 03/20/12 1743      Chief Complaint  Patient presents with  . Headache  . Back Pain    (Consider location/radiation/quality/duration/timing/severity/associated sxs/prior treatment) HPI Deanna Clark is a 56 y.o. female who presents to the Emergency Department complaining of sharp left upper back pain and a global headache (worst in temporal and occipital areas) with associated blurred vision, numbness/tingling in the fingers, weakness, nausea (but no emesis), and "jerks" since about an hour ago. Pt reports the pain is aggravated by looking up, noise, and light. She reports taking a Vicodin at 8 am this morning with no relief from pain.  Pt smokes about 1 pack/day and has no PCP.   Past Medical History  Diagnosis Date  . Anxiety     use to see Psychiatrist   . Depression     previously took paxil in the past - no meds in years   . GERD (gastroesophageal reflux disease)   . Hyperlipidemia   . Hypertension   . Low back pain   . Chronic neck pain   . IBS (irritable bowel syndrome)     with constipation  . Migraines   . Bipolar disorder     Past Surgical History  Procedure Date  . Cholecystectomy 2000  . C5-6 fusion 2001    [name]  . States she had left shoulder surgery - cant remember when  and no record in [facility]     r shoulder surgery  . Total abdominal hysterectomy w/ bilateral salpingoophorectomy     Family History  Problem Relation Age of Onset  . Cancer Mother     lung     History  Substance Use Topics  . Smoking status: Current Everyday Smoker -- 1.0 packs/day    Types: Cigarettes  . Smokeless tobacco: Not on file  . Alcohol Use: No    OB History    Grav Para Term Preterm Abortions TAB SAB  Ect Mult Living                  Review of Systems A complete 10 system review of systems was obtained and all systems are negative except as noted in the HPI and PMH.    Allergies  Hydrocodone; Ibuprofen; Propoxyphene-acetaminophen; and Robaxin  Home Medications   Current Outpatient Rx  Name Route Sig Dispense Refill  . CYCLOBENZAPRINE HCL 10 MG PO TABS Oral Take 1 tablet (10 mg total) by mouth 3 (three) times daily as needed for muscle spasms. 15 tablet 0  . HYDROCODONE-ACETAMINOPHEN 5-325 MG PO TABS Oral Take 1 tablet by mouth every 4 (four) hours as needed for pain. 20 tablet 0  . ISOMETHEPTENE-APAP-DICHLORAL 65-325-100 MG PO CAPS Oral Take 1 capsule by mouth 4 (four) times daily as needed. 30 capsule 0    Triage Vitals: ,BP 150/115  Pulse 81  Temp 98.2 F (36.8 C) (Oral)  Resp 17  SpO2 96%  Physical Exam  Nursing note and vitals reviewed. Constitutional: She is oriented to person, place, and time. She appears well-developed and well-nourished. No distress.  HENT:  Head: Normocephalic and atraumatic.  Eyes: EOM are normal.       Fundi are normal.   Neck: Neck supple. No  tracheal deviation present.  Cardiovascular: Normal rate.   Pulmonary/Chest: Effort normal. No respiratory distress.  Abdominal: Soft.  Musculoskeletal: Normal range of motion. She exhibits tenderness.       Tender in left suprascapular area.    Neurological: She is alert and oriented to person, place, and time.  Skin: Skin is warm and dry.  Psychiatric: She has a normal mood and affect. Her behavior is normal.    ED Course  Procedures (including critical care time) DIAGNOSTIC STUDIES: Oxygen Saturation is 96% on room air, adequate by my interpretation.    COORDINATION OF CARE: 17:55--I evaluated the patient and we discussed a treatment plan including pain medication to which the pt agreed. I informed the pt that smoking cessation could decrease her headaches.    18:00--Medication orders:  Sodium chloride 0.9% bolus 1,000 mL--once   Metoclopramide (Reglan) injection 10 mg--once   Diphenhydramine (Benadryl) injection 25 mg--once   Hydromorphone (Dilaudid) injection 1 mg--once  18:40--I rechecked the pt who is feeling better and ready to go home.    1. Headache   2. Pain, upper back       MDM  Headache which is probably muscle contraction headache. Upper back pain of uncertain cause. Her prior records are reviewed and she has multiple ED visits in the last 2 months for a variety of painful conditions and has always been given a single injection of hydromorphone. When she has come for headaches, she is also been given metoclopramide. She's given IV fluids, and IV metoclopramide, IV diphenhydramine, IV hydromorphone. Fundus C. since better and she is discharged.  I personally performed the services described in this documentation, which was scribed in my presence. The recorded information has been reviewed and considered.            Deanna Booze, MD 03/20/12 [phone]

## 2012-03-20 NOTE — ED Notes (Signed)
Left in c/o family for transport home; instructions/prescriptions reviewed and f/u information provided; verbalizes understanding.

## 2012-03-20 NOTE — ED Notes (Signed)
Pt c/o migraine headache that started 20 minutes ago, pain is associated with nausea, light and sounds sensitivity, pt states that she has had problems with migraine headaches in the past and that this headache is similar to the headaches in the past, pt also c/o "jerking" and pain located in left shoulder at the scapula area denies any injury, states that she has been seen for the pain in her left shoulder before and does not know what is wrong. Comfort measures provided to pt, lights dimmed.

## 2012-03-20 NOTE — ED Notes (Signed)
Pt  C/o headache and L upper side of back hurting do to "jerks". States her upper body is jerking and she doesn't know what it is. Nad. Alert/oirented. Nausea/dizziness

## 2012-03-26 ENCOUNTER — Encounter (HOSPITAL_COMMUNITY): Payer: Self-pay | Admitting: *Deleted

## 2012-03-26 ENCOUNTER — Emergency Department (HOSPITAL_COMMUNITY)
Admission: EM | Admit: 2012-03-26 | Discharge: 2012-03-26 | Disposition: A | Discharge status: Home / Self Care | Payer: Medicaid Other | Attending: Emergency Medicine | Admitting: Emergency Medicine

## 2012-03-26 ENCOUNTER — Emergency Department (HOSPITAL_COMMUNITY): Payer: Medicaid Other

## 2012-03-26 DIAGNOSIS — R51 Headache: Secondary | ICD-10-CM | POA: Insufficient documentation

## 2012-03-26 DIAGNOSIS — R110 Nausea: Secondary | ICD-10-CM | POA: Insufficient documentation

## 2012-03-26 DIAGNOSIS — F17200 Nicotine dependence, unspecified, uncomplicated: Secondary | ICD-10-CM | POA: Insufficient documentation

## 2012-03-26 DIAGNOSIS — F411 Generalized anxiety disorder: Secondary | ICD-10-CM | POA: Insufficient documentation

## 2012-03-26 DIAGNOSIS — K219 Gastro-esophageal reflux disease without esophagitis: Secondary | ICD-10-CM | POA: Insufficient documentation

## 2012-03-26 DIAGNOSIS — I10 Essential (primary) hypertension: Secondary | ICD-10-CM | POA: Insufficient documentation

## 2012-03-26 DIAGNOSIS — M25569 Pain in unspecified knee: Secondary | ICD-10-CM | POA: Insufficient documentation

## 2012-03-26 DIAGNOSIS — K589 Irritable bowel syndrome without diarrhea: Secondary | ICD-10-CM | POA: Insufficient documentation

## 2012-03-26 DIAGNOSIS — F319 Bipolar disorder, unspecified: Secondary | ICD-10-CM | POA: Insufficient documentation

## 2012-03-26 DIAGNOSIS — E785 Hyperlipidemia, unspecified: Secondary | ICD-10-CM | POA: Insufficient documentation

## 2012-03-26 MED ORDER — SODIUM CHLORIDE 0.9 % IV BOLUS (SEPSIS)
1000.0000 mL | Freq: Once | INTRAVENOUS | Status: AC
  Administered 2012-03-26: 1000 mL via INTRAVENOUS

## 2012-03-26 MED ORDER — NAPROXEN 500 MG PO TABS: 500.0000 mg | ORAL_TABLET | Freq: Two times a day (BID) | ORAL | Status: DC

## 2012-03-26 MED ORDER — ONDANSETRON HCL 4 MG/2ML IJ SOLN
4.0000 mg | Freq: Once | INTRAMUSCULAR | Status: AC
  Administered 2012-03-26: 4 mg via INTRAVENOUS
  Filled 2012-03-26: qty 2

## 2012-03-26 MED ORDER — DIPHENHYDRAMINE HCL 50 MG/ML IJ SOLN
25.0000 mg | Freq: Once | INTRAMUSCULAR | Status: AC
  Administered 2012-03-26: 25 mg via INTRAVENOUS
  Filled 2012-03-26: qty 1

## 2012-03-26 MED ORDER — METOCLOPRAMIDE HCL 5 MG/ML IJ SOLN
10.0000 mg | Freq: Once | INTRAMUSCULAR | Status: AC
  Administered 2012-03-26: 10 mg via INTRAVENOUS
  Filled 2012-03-26: qty 2

## 2012-03-26 MED ORDER — KETOROLAC TROMETHAMINE 30 MG/ML IJ SOLN
30.0000 mg | Freq: Once | INTRAMUSCULAR | Status: AC
  Administered 2012-03-26: 30 mg via INTRAVENOUS
  Filled 2012-03-26: qty 1

## 2012-03-26 MED ORDER — ONDANSETRON HCL 4 MG/2ML IJ SOLN
4.0000 mg | Freq: Once | INTRAMUSCULAR | Status: AC
  Administered 2012-03-26: 4 mg via INTRAVENOUS

## 2012-03-26 NOTE — ED Provider Notes (Signed)
History  This chart was scribed for Deanna Octave, MD by Deanna Clark. This patient was seen in room APA05/APA05 and the patient's care was started at 18:58.   CSN: [id number]  Arrival date & time 03/26/12  1850   First MD Initiated Contact with Patient 03/26/12 1858      Chief Complaint  Patient presents with  . Migraine  . Knee Pain  . Nausea    (Consider location/radiation/quality/duration/timing/severity/associated sxs/prior treatment) HPI [name] is a 56 y.o. female who presents to the Emergency Department complaining of a gradually worsening migraine with pain that radiates to behind the eyes since 5 pm this evening with associated nausea, 2 episodes of emesis, photophobia, and sensitivity to sound. Pt reports when she stands up everything is blurry but she isn't seeing any flashing lights. Pt reports a h/o migraines (that she has been seen here for) and claims this one is the same in quality and severity as previous episodes. Pt also reports left knee pain since she broke it 2 years ago. Pt denies any new knee injuries or taking anything for the knee pain. Pt has no known allergies and no PCP.     Past Medical History  Diagnosis Date  . Anxiety     use to see Psychiatrist   . Depression     previously took paxil in the past - no meds in years   . GERD (gastroesophageal reflux disease)   . Hyperlipidemia   . Hypertension   . Low back pain   . Chronic neck pain   . IBS (irritable bowel syndrome)     with constipation  . Migraines   . Bipolar disorder     Past Surgical History  Procedure Date  . Cholecystectomy 2000  . C5-6 fusion 2001    [name]  . States she had left shoulder surgery - cant remember when  and no record in [facility]     r shoulder surgery  . Total abdominal hysterectomy w/ bilateral salpingoophorectomy     Family History  Problem Relation Age of Onset  . Cancer Mother     lung     History  Substance Use Topics  . Smoking  status: Current Everyday Smoker -- 1.0 packs/day    Types: Cigarettes  . Smokeless tobacco: Not on file  . Alcohol Use: No    OB History    Grav Para Term Preterm Abortions TAB SAB Ect Mult Living                  Review of Systems A complete 10 system review of systems was obtained and all systems are negative except as noted in the HPI and PMH.    Allergies  Hydrocodone; Ibuprofen; Propoxyphene-acetaminophen; and Robaxin  Home Medications   Current Outpatient Rx  Name Route Sig Dispense Refill  . ASPIRIN-SALICYLAMIDE-CAFFEINE 325-95-16 MG PO TABS Oral Take 1 packet by mouth as needed.    Marland Kitchen NAPROXEN 500 MG PO TABS Oral Take 1 tablet (500 mg total) by mouth 2 (two) times daily. 30 tablet 0    Triage Vitals: BP 126/89  Pulse 100  Temp 98.2 F (36.8 C) (Oral)  Resp 20  Ht 5\' 5"  (1.651 m)  Wt 130 lb (58.968 kg)  BMI 21.63 kg/m2  SpO2 99%  Physical Exam  Nursing note and vitals reviewed. Constitutional: She is oriented to person, place, and time. She appears well-developed and well-nourished. No distress.  HENT:  Head: Normocephalic and atraumatic.  Eyes: EOM are normal. Pupils are equal, round, and reactive to light.  Neck: Normal range of motion. Neck supple. No tracheal deviation present.       No meningismus.  Cardiovascular: Normal rate and normal heart sounds.   Pulmonary/Chest: Effort normal. No respiratory distress.  Abdominal: Soft. She exhibits no distension.  Musculoskeletal: Normal range of motion. She exhibits no edema.       Left knee: no effusion, flexion and extrension intact. + 2 dorsalis pedis pulse. No ligament laxity on varius, valigus, or drawer.  Neurological: She is alert and oriented to person, place, and time. No cranial nerve deficit. Coordination normal.       Cranial nerves 2-12 intact. Visual fields intact.  Skin: Skin is warm and dry.  Psychiatric: She has a normal mood and affect.    ED Course  Procedures (including critical care  time) DIAGNOSTIC STUDIES: Oxygen Saturation is 99% on room air, normal by my interpretation.    COORDINATION OF CARE: 19:05--I evaluated the patient and we discussed a treatment plan including a knee x-ray to which the pt agreed.   20:45--I rechecked the pt. She reports she is still having some knee pain but her migraine has been relieved.   Labs Reviewed - No data to display Dg Knee Complete 4 Views Left  03/26/2012  *RADIOLOGY REPORT*  Clinical Data: Left knee pain  LEFT KNEE - COMPLETE 4+ VIEW  Comparison: March 18, 2010  Findings: There is no acute fracture or dislocation.  No radiopaque foreign body is identified.  IMPRESSION: No acute fracture or dislocation.  Original Report Authenticated By: Deanna Clark, M.D.     1. Headache   2. Knee pain       MDM  Gradual onset headache since this evening with nausea similar to previous migraines.left knee pain for 2 years after "breaking it".    Headache cocktail given. No neurological deficits. Headache improved.  Xray negative for fracture. F/u ortho as needed for knee pain.   I personally performed the services described in this documentation, which was scribed in my presence.  The recorded information has been reviewed and considered.   Deanna Octave, MD 03/27/12 [phone]

## 2012-03-26 NOTE — ED Notes (Signed)
Pt c/o migraine headache, left knee pain, denies any injury, migraine is associated with n/v, pain started today,

## 2012-03-26 NOTE — ED Notes (Signed)
Informed Nurse Tech she is still nauseated at this time - had not mentioned since initial medication given

## 2012-04-13 ENCOUNTER — Emergency Department (HOSPITAL_COMMUNITY)
Admission: EM | Admit: 2012-04-13 | Discharge: 2012-04-13 | Disposition: A | Discharge status: Home / Self Care | Payer: Medicaid Other | Attending: Emergency Medicine | Admitting: Emergency Medicine

## 2012-04-13 ENCOUNTER — Encounter (HOSPITAL_COMMUNITY): Payer: Self-pay

## 2012-04-13 DIAGNOSIS — F319 Bipolar disorder, unspecified: Secondary | ICD-10-CM | POA: Insufficient documentation

## 2012-04-13 DIAGNOSIS — K589 Irritable bowel syndrome without diarrhea: Secondary | ICD-10-CM | POA: Insufficient documentation

## 2012-04-13 DIAGNOSIS — M545 Low back pain, unspecified: Secondary | ICD-10-CM | POA: Insufficient documentation

## 2012-04-13 DIAGNOSIS — R51 Headache: Secondary | ICD-10-CM | POA: Insufficient documentation

## 2012-04-13 DIAGNOSIS — K219 Gastro-esophageal reflux disease without esophagitis: Secondary | ICD-10-CM | POA: Insufficient documentation

## 2012-04-13 DIAGNOSIS — F411 Generalized anxiety disorder: Secondary | ICD-10-CM | POA: Insufficient documentation

## 2012-04-13 DIAGNOSIS — G8929 Other chronic pain: Secondary | ICD-10-CM | POA: Insufficient documentation

## 2012-04-13 DIAGNOSIS — E785 Hyperlipidemia, unspecified: Secondary | ICD-10-CM | POA: Insufficient documentation

## 2012-04-13 DIAGNOSIS — I10 Essential (primary) hypertension: Secondary | ICD-10-CM | POA: Insufficient documentation

## 2012-04-13 DIAGNOSIS — R112 Nausea with vomiting, unspecified: Secondary | ICD-10-CM | POA: Insufficient documentation

## 2012-04-13 MED ORDER — DIPHENHYDRAMINE HCL 50 MG/ML IJ SOLN
25.0000 mg | Freq: Once | INTRAMUSCULAR | Status: AC
  Administered 2012-04-13: 25 mg via INTRAMUSCULAR
  Filled 2012-04-13: qty 1

## 2012-04-13 MED ORDER — DIPHENHYDRAMINE HCL 50 MG/ML IJ SOLN: 25.0000 mg | Freq: Once | INTRAMUSCULAR | Status: DC

## 2012-04-13 MED ORDER — KETOROLAC TROMETHAMINE 60 MG/2ML IM SOLN
60.0000 mg | Freq: Once | INTRAMUSCULAR | Status: AC
  Administered 2012-04-13: 60 mg via INTRAMUSCULAR
  Filled 2012-04-13: qty 2

## 2012-04-13 MED ORDER — METOCLOPRAMIDE HCL 5 MG/ML IJ SOLN
10.0000 mg | Freq: Once | INTRAMUSCULAR | Status: AC
  Administered 2012-04-13: 10 mg via INTRAMUSCULAR
  Filled 2012-04-13: qty 2

## 2012-04-13 NOTE — ED Notes (Signed)
1. Pt reports having chronic back pain, but pain is worse over the last week, denies any recent injury to back 2. Migraine headache for 3 hours.

## 2012-04-13 NOTE — ED Notes (Signed)
Pt c/o migraine that began earlier today. Pt states she has hx of migraines and her normal meds are not working.

## 2012-04-13 NOTE — ED Provider Notes (Signed)
History   This chart was scribed for Glynn Octave, MD scribed by Magnus Sinning. The patient was seen in room APA12/APA12 at 15:16   CSN: [id number]  Arrival date & time 04/13/12  1451   First MD Initiated Contact with Patient 04/13/12 1510      Chief Complaint  Patient presents with  . Back Pain    more painful this week, but has chronic pain  . Migraine    (Consider location/radiation/quality/duration/timing/severity/associated sxs/prior treatment) Patient is a 56 y.o. female presenting with back pain and migraine. The history is provided by the patient. No language interpreter was used.  Back Pain   Migraine  Deanna Clark is a 56 y.o. female who presents to the Emergency Department complaining of gradual onset and gradually worsening moderate HA, onset 2 hours ago, with associated nausea, vomiting and photophobia. Patient states that she has a hx of migraines and notes today's is similar to previous episodes of HA. She explains that she's taken goody powder and BC's with no relief. Pt states she has hx of chronic back pain, but says she has no other medical conditions.  PCP: None  Past Medical History  Diagnosis Date  . Anxiety     use to see Psychiatrist   . Depression     previously took paxil in the past - no meds in years   . GERD (gastroesophageal reflux disease)   . Hyperlipidemia   . Hypertension   . Low back pain   . Chronic neck pain   . IBS (irritable bowel syndrome)     with constipation  . Migraines   . Bipolar disorder     Past Surgical History  Procedure Date  . Cholecystectomy 2000  . C5-6 fusion 2001    [name]  . States she had left shoulder surgery - cant remember when  and no record in [facility]     r shoulder surgery  . Total abdominal hysterectomy w/ bilateral salpingoophorectomy   . Abdominal hysterectomy     Family History  Problem Relation Age of Onset  . Cancer Mother     lung     History  Substance Use Topics  .  Smoking status: Current Everyday Smoker -- 1.0 packs/day    Types: Cigarettes  . Smokeless tobacco: Not on file  . Alcohol Use: No   Review of Systems  10 Systems reviewed and are negative for acute change except as noted in the HPI. Allergies  Hydrocodone; Ibuprofen; Propoxyphene-acetaminophen; and Robaxin  Home Medications   Current Outpatient Rx  Name Route Sig Dispense Refill  . ASPIRIN-SALICYLAMIDE-CAFFEINE 325-95-16 MG PO TABS Oral Take 1 packet by mouth as needed.    Marland Kitchen NAPROXEN 500 MG PO TABS Oral Take 1 tablet (500 mg total) by mouth 2 (two) times daily. 30 tablet 0    BP 136/104  Pulse 98  Temp 98.2 F (36.8 C) (Oral)  Resp 20  Ht 5\' 7"  (1.702 m)  Wt 130 lb (58.968 kg)  BMI 20.36 kg/m2  SpO2 100%  Physical Exam  Nursing note and vitals reviewed. Constitutional: She is oriented to person, place, and time. She appears well-developed and well-nourished. No distress.  HENT:  Head: Normocephalic and atraumatic.  Eyes: Conjunctivae and EOM are normal.       No nystagmus. Fundoscopic exam nml.   Neck: Neck supple. No tracheal deviation present.       No meningismus.   Cardiovascular: Normal rate.   Pulmonary/Chest: Effort normal. No  respiratory distress.  Abdominal: She exhibits no distension.  Musculoskeletal: Normal range of motion.       5/5 strength. Equal grip strength.  Neurological: She is alert and oriented to person, place, and time. No cranial nerve deficit or sensory deficit.  Skin: Skin is dry.  Psychiatric: She has a normal mood and affect. Her behavior is normal.    ED Course  Procedures (including critical care time) DIAGNOSTIC STUDIES: Oxygen Saturation is 100% on room air, normal by my interpretation.    COORDINATION OF CARE: 16:55: Performed recheck. Patient notes improvement. Plans to d/c home.   Labs Reviewed - No data to display No results found.   No diagnosis found.    MDM  Gradual onset headache similar to previous migraines.  Associated with nausea, vomiting and photophobia.   Headache is improved medications. Patient feels ready to go home. She is tolerating by mouth. To followup with neurology as an outpatient.  I personally performed the services described in this documentation, which was scribed in my presence.  The recorded information has been reviewed and considered.         Glynn Octave, MD 04/13/12 [phone]

## 2012-06-04 ENCOUNTER — Encounter (HOSPITAL_COMMUNITY): Payer: Self-pay | Admitting: Emergency Medicine

## 2012-06-04 ENCOUNTER — Emergency Department (HOSPITAL_COMMUNITY)
Admission: EM | Admit: 2012-06-04 | Discharge: 2012-06-04 | Disposition: A | Discharge status: Home / Self Care | Payer: Medicaid Other | Attending: Emergency Medicine | Admitting: Emergency Medicine

## 2012-06-04 DIAGNOSIS — G8929 Other chronic pain: Secondary | ICD-10-CM | POA: Insufficient documentation

## 2012-06-04 DIAGNOSIS — M25519 Pain in unspecified shoulder: Secondary | ICD-10-CM | POA: Insufficient documentation

## 2012-06-04 DIAGNOSIS — E785 Hyperlipidemia, unspecified: Secondary | ICD-10-CM | POA: Insufficient documentation

## 2012-06-04 DIAGNOSIS — I10 Essential (primary) hypertension: Secondary | ICD-10-CM | POA: Insufficient documentation

## 2012-06-04 DIAGNOSIS — M542 Cervicalgia: Secondary | ICD-10-CM | POA: Insufficient documentation

## 2012-06-04 DIAGNOSIS — G43909 Migraine, unspecified, not intractable, without status migrainosus: Secondary | ICD-10-CM | POA: Insufficient documentation

## 2012-06-04 MED ORDER — HYDROMORPHONE HCL PF 2 MG/ML IJ SOLN
2.0000 mg | Freq: Once | INTRAMUSCULAR | Status: AC
  Administered 2012-06-04: 2 mg via INTRAMUSCULAR
  Filled 2012-06-04: qty 1

## 2012-06-04 MED ORDER — OXYCODONE-ACETAMINOPHEN 5-325 MG PO TABS: 2.0000 | ORAL_TABLET | ORAL | Status: DC | PRN

## 2012-06-04 MED ORDER — METOCLOPRAMIDE HCL 5 MG/ML IJ SOLN
10.0000 mg | Freq: Once | INTRAMUSCULAR | Status: AC
  Administered 2012-06-04: 10 mg via INTRAMUSCULAR
  Filled 2012-06-04: qty 2

## 2012-06-04 MED ORDER — METOCLOPRAMIDE HCL 10 MG PO TABS: 10.0000 mg | ORAL_TABLET | Freq: Four times a day (QID) | ORAL | Status: DC | PRN

## 2012-06-04 MED ORDER — DIPHENHYDRAMINE HCL 50 MG/ML IJ SOLN
50.0000 mg | Freq: Once | INTRAMUSCULAR | Status: AC
  Administered 2012-06-04: 50 mg via INTRAMUSCULAR
  Filled 2012-06-04: qty 1

## 2012-06-04 MED ORDER — ONDANSETRON 8 MG PO TBDP: ORAL_TABLET | ORAL | Status: DC

## 2012-06-04 NOTE — ED Notes (Signed)
Pt c/o bil shoulder pain.  St's she has had surg on both shoulders but they continue to hurt.  Also c/o migraine headache and not being able to sleep due to pain.

## 2012-06-04 NOTE — ED Provider Notes (Signed)
History    This chart was scribed for Hurman Horn, MD by Albertha Ghee Rifaie. This patient was seen in room TR05C/TR05C and the patient's care was started at 6:03 PM.  CSN: [id number]  Arrival date & time 06/04/12  1543   None     Chief Complaint  Patient presents with  . Shoulder Pain  . Headache    The history is provided by the patient. No language interpreter was used.   Deanna Clark is a 56 y.o. female who presents to the Emergency Department complaining of gradually worsening, Chronic bilateral shoulder pain that radiates to the back and is worsening by movement. She also c/o typical gradual, onset HA, and migrants. Pt states that she feels nauseated and vomited once today.  She denies being dehydrated. Pt also denies fever, confusion, chills, stroke like symptoms. She is a current everyday smoker but denies alcohol use.     Past Medical History  Diagnosis Date  . Anxiety     use to see Psychiatrist   . Depression     previously took paxil in the past - no meds in years   . GERD (gastroesophageal reflux disease)   . Hyperlipidemia   . Hypertension   . Low back pain   . Chronic neck pain   . IBS (irritable bowel syndrome)     with constipation  . Migraines   . Bipolar disorder     Past Surgical History  Procedure Date  . Cholecystectomy 2000  . C5-6 fusion 2001    [name]  . States she had left shoulder surgery - cant remember when  and no record in [facility]     r shoulder surgery  . Total abdominal hysterectomy w/ bilateral salpingoophorectomy   . Abdominal hysterectomy     Family History  Problem Relation Age of Onset  . Cancer Mother     lung     History  Substance Use Topics  . Smoking status: Current Every Day Smoker -- 1.0 packs/day    Types: Cigarettes  . Smokeless tobacco: Not on file  . Alcohol Use: No    No OB history provided  Review of Systems  10 Systems reviewed and all are negative for acute change except as noted  in the HPI.  Allergies  Hydrocodone; Ibuprofen; Propoxyphene-acetaminophen; and Robaxin  Home Medications   Current Outpatient Rx  Name Route Sig Dispense Refill  . METOCLOPRAMIDE HCL 10 MG PO TABS Oral Take 1 tablet (10 mg total) by mouth every 6 (six) hours as needed (nausea/headache). 6 tablet 0  . ONDANSETRON 8 MG PO TBDP  8mg  ODT q4 hours prn nausea 4 tablet 0  . OXYCODONE-ACETAMINOPHEN 5-325 MG PO TABS Oral Take 2 tablets by mouth every 4 (four) hours as needed for pain. 6 tablet 0    BP 126/83  Pulse 107  Temp 98.5 F (36.9 C) (Oral)  Resp 18  SpO2 95%  Physical Exam  Nursing note and vitals reviewed. Constitutional:       Awake, alert, nontoxic appearance.  HENT:  Head: Atraumatic.  Mouth/Throat: No oropharyngeal exudate.  Eyes: EOM are normal. Pupils are equal, round, and reactive to light. Right eye exhibits no discharge. Left eye exhibits no discharge.  Neck: Neck supple.       Diffuse neck and back tenderness   Cardiovascular: Normal rate and regular rhythm.   No murmur heard. Pulmonary/Chest: Effort normal and breath sounds normal. No stridor. No respiratory distress. She has  no wheezes. She has no rales. She exhibits no tenderness.  Abdominal: Soft. Bowel sounds are normal. She exhibits no mass. There is no tenderness. There is no rebound.  Musculoskeletal: She exhibits no tenderness.       Baseline ROM, no obvious new focal weakness.  Lymphadenopathy:    She has no cervical adenopathy.  Neurological:       Mental status and motor strength appears baseline for patient and situation.  Skin: No rash noted.  Psychiatric: She has a normal mood and affect.    ED Course  Procedures (including critical care time)  Labs Reviewed - No data to display No results found.  DIAGNOSTIC STUDIES: Oxygen Saturation is 95% on room air, adequate by my interpretation.    COORDINATION OF CARE: 6:03PM Patient / Family / Caregiver informed of clinical course, understand  medical decision-making process, and agree with plan.    1. Migraine   2. Chronic neck pain       MDM  I personally performed the services described in this documentation, which was scribed in my presence. The recorded information has been reviewed and considered.   Hurman Horn, MD 06/08/12 [phone]

## 2012-06-04 NOTE — ED Notes (Signed)
Pt c/o pain in both shoulders.  States that the pain is chronic but last night and today it became unbearable. Also c/o headache.

## 2012-08-16 ENCOUNTER — Emergency Department (HOSPITAL_COMMUNITY)
Admission: EM | Admit: 2012-08-16 | Discharge: 2012-08-16 | Disposition: A | Discharge status: Home / Self Care | Payer: Medicaid Other | Attending: Emergency Medicine | Admitting: Emergency Medicine

## 2012-08-16 ENCOUNTER — Encounter (HOSPITAL_COMMUNITY): Payer: Self-pay | Admitting: *Deleted

## 2012-08-16 DIAGNOSIS — Z79899 Other long term (current) drug therapy: Secondary | ICD-10-CM | POA: Insufficient documentation

## 2012-08-16 DIAGNOSIS — Z90710 Acquired absence of both cervix and uterus: Secondary | ICD-10-CM | POA: Insufficient documentation

## 2012-08-16 DIAGNOSIS — Z9089 Acquired absence of other organs: Secondary | ICD-10-CM | POA: Insufficient documentation

## 2012-08-16 DIAGNOSIS — F17200 Nicotine dependence, unspecified, uncomplicated: Secondary | ICD-10-CM | POA: Insufficient documentation

## 2012-08-16 DIAGNOSIS — I10 Essential (primary) hypertension: Secondary | ICD-10-CM | POA: Insufficient documentation

## 2012-08-16 DIAGNOSIS — Z8679 Personal history of other diseases of the circulatory system: Secondary | ICD-10-CM | POA: Insufficient documentation

## 2012-08-16 DIAGNOSIS — Z8719 Personal history of other diseases of the digestive system: Secondary | ICD-10-CM | POA: Insufficient documentation

## 2012-08-16 DIAGNOSIS — Z8659 Personal history of other mental and behavioral disorders: Secondary | ICD-10-CM | POA: Insufficient documentation

## 2012-08-16 DIAGNOSIS — G8929 Other chronic pain: Secondary | ICD-10-CM | POA: Insufficient documentation

## 2012-08-16 DIAGNOSIS — E785 Hyperlipidemia, unspecified: Secondary | ICD-10-CM | POA: Insufficient documentation

## 2012-08-16 DIAGNOSIS — M25569 Pain in unspecified knee: Secondary | ICD-10-CM | POA: Insufficient documentation

## 2012-08-16 MED ORDER — ONDANSETRON 4 MG PO TBDP
4.0000 mg | ORAL_TABLET | Freq: Once | ORAL | Status: AC
  Administered 2012-08-16: 4 mg via ORAL
  Filled 2012-08-16: qty 1

## 2012-08-16 MED ORDER — TRAMADOL HCL 50 MG PO TABS: ORAL_TABLET | ORAL | Status: DC

## 2012-08-16 MED ORDER — TRAMADOL HCL 50 MG PO TABS
100.0000 mg | ORAL_TABLET | Freq: Once | ORAL | Status: AC
  Administered 2012-08-16: 100 mg via ORAL
  Filled 2012-08-16: qty 2

## 2012-08-16 MED ORDER — ONDANSETRON 4 MG PO TBDP: 4.0000 mg | ORAL_TABLET | Freq: Three times a day (TID) | ORAL | Status: DC | PRN

## 2012-08-16 NOTE — ED Provider Notes (Signed)
History     CSN: [id number]  Arrival date & time 08/16/12  1821   First MD Initiated Contact with Patient 08/16/12 2031      Chief Complaint  Patient presents with  . Back Pain  . Knee Pain    (Consider location/radiation/quality/duration/timing/severity/associated sxs/prior treatment) HPI Comments: Has an appt next week to see marvin rainwater, pac for evaluation of complaints and fo pain management.  Patient is a 56 y.o. female presenting with back pain and knee pain. The history is provided by the patient. No language interpreter was used.  Back Pain  This is a chronic problem. Episode onset: years ago. The problem occurs constantly. The problem has not changed since onset.The pain is associated with no known injury. The pain is present in the lumbar spine. The symptoms are aggravated by bending and twisting. The pain is the same all the time. Pertinent negatives include no fever and no numbness. She has tried nothing for the symptoms.  Knee Pain Pertinent negatives include no fever or numbness.    Past Medical History  Diagnosis Date  . Anxiety     use to see Psychiatrist   . Depression     previously took paxil in the past - no meds in years   . GERD (gastroesophageal reflux disease)   . Hyperlipidemia   . Hypertension   . Low back pain   . Chronic neck pain   . IBS (irritable bowel syndrome)     with constipation  . Migraines   . Bipolar disorder     Past Surgical History  Procedure Date  . Cholecystectomy 2000  . C5-6 fusion 2001    [name]  . States she had left shoulder surgery - cant remember when  and no record in [facility]     r shoulder surgery  . Total abdominal hysterectomy w/ bilateral salpingoophorectomy   . Abdominal hysterectomy     Family History  Problem Relation Age of Onset  . Cancer Mother     lung     History  Substance Use Topics  . Smoking status: Current Every Day Smoker -- 1.0 packs/day    Types: Cigarettes  .  Smokeless tobacco: Not on file  . Alcohol Use: No    OB History    Grav Para Term Preterm Abortions TAB SAB Ect Mult Living                  Review of Systems  Constitutional: Negative for fever.  Musculoskeletal: Positive for back pain.       Knee pain   Neurological: Negative for numbness.  All other systems reviewed and are negative.    Allergies  Hydrocodone; Ibuprofen; Propoxyphene-acetaminophen; and Robaxin  Home Medications   Current Outpatient Rx  Name  Route  Sig  Dispense  Refill  . PRESCRIPTION MEDICATION   Oral   Take 1 tablet by mouth once as needed. For blood pressure         . ONDANSETRON 4 MG PO TBDP   Oral   Take 1 tablet (4 mg total) by mouth every 8 (eight) hours as needed for nausea.   20 tablet   0   . TRAMADOL HCL 50 MG PO TABS      One to two tabs q 6-8 hrs prn pain   30 tablet   0     BP 115/79  Pulse 67  Temp 97.8 F (36.6 C) (Oral)  Resp 20  Ht 5\' 5"  (  1.651 m)  Wt 130 lb (58.968 kg)  BMI 21.63 kg/m2  SpO2 100%  Physical Exam  Nursing note and vitals reviewed. Constitutional: She is oriented to person, place, and time. She appears well-developed and well-nourished. No distress.  HENT:  Head: Normocephalic and atraumatic.  Eyes: EOM are normal.  Neck: Normal range of motion.  Cardiovascular: Normal rate and regular rhythm.   Pulmonary/Chest: Effort normal.  Abdominal: Soft. She exhibits no distension. There is no tenderness.  Musculoskeletal:       Back:       Legs: Neurological: She is alert and oriented to person, place, and time.  Skin: Skin is warm and dry.  Psychiatric: She has a normal mood and affect. Judgment normal.    ED Course  Procedures (including critical care time)  Labs Reviewed - No data to display No results found.   1. Chronic pain       MDM  [id number] [id number] F/u with your pain management clinic.        Evalina Field, [place] 08/16/12 2139

## 2012-08-16 NOTE — ED Notes (Addendum)
Pt states left knee pain x 2 days. Old injury last year. Pain flared up last night. Lower back pain. Nausea but denies vomiting.

## 2012-08-16 NOTE — ED Notes (Addendum)
Pain low back and lt knee,  Injury to lt knee 1 year ago. Nausea, no vomiting.

## 2012-08-17 NOTE — ED Provider Notes (Signed)
Medical screening examination/treatment/procedure(s) were performed by non-physician practitioner and as supervising physician I was immediately available for consultation/collaboration.  Donnetta Hutching, MD 08/17/12 [phone]

## 2013-08-20 IMAGING — CR DG ABDOMEN 1V
1 series · 1 of 1 positions shown · non-contrast
Comparison: CT of the abdomen dated 05/15/2005

CLINICAL DATA: Vomiting.

ABDOMEN - 1 VIEW

[view not recorded]
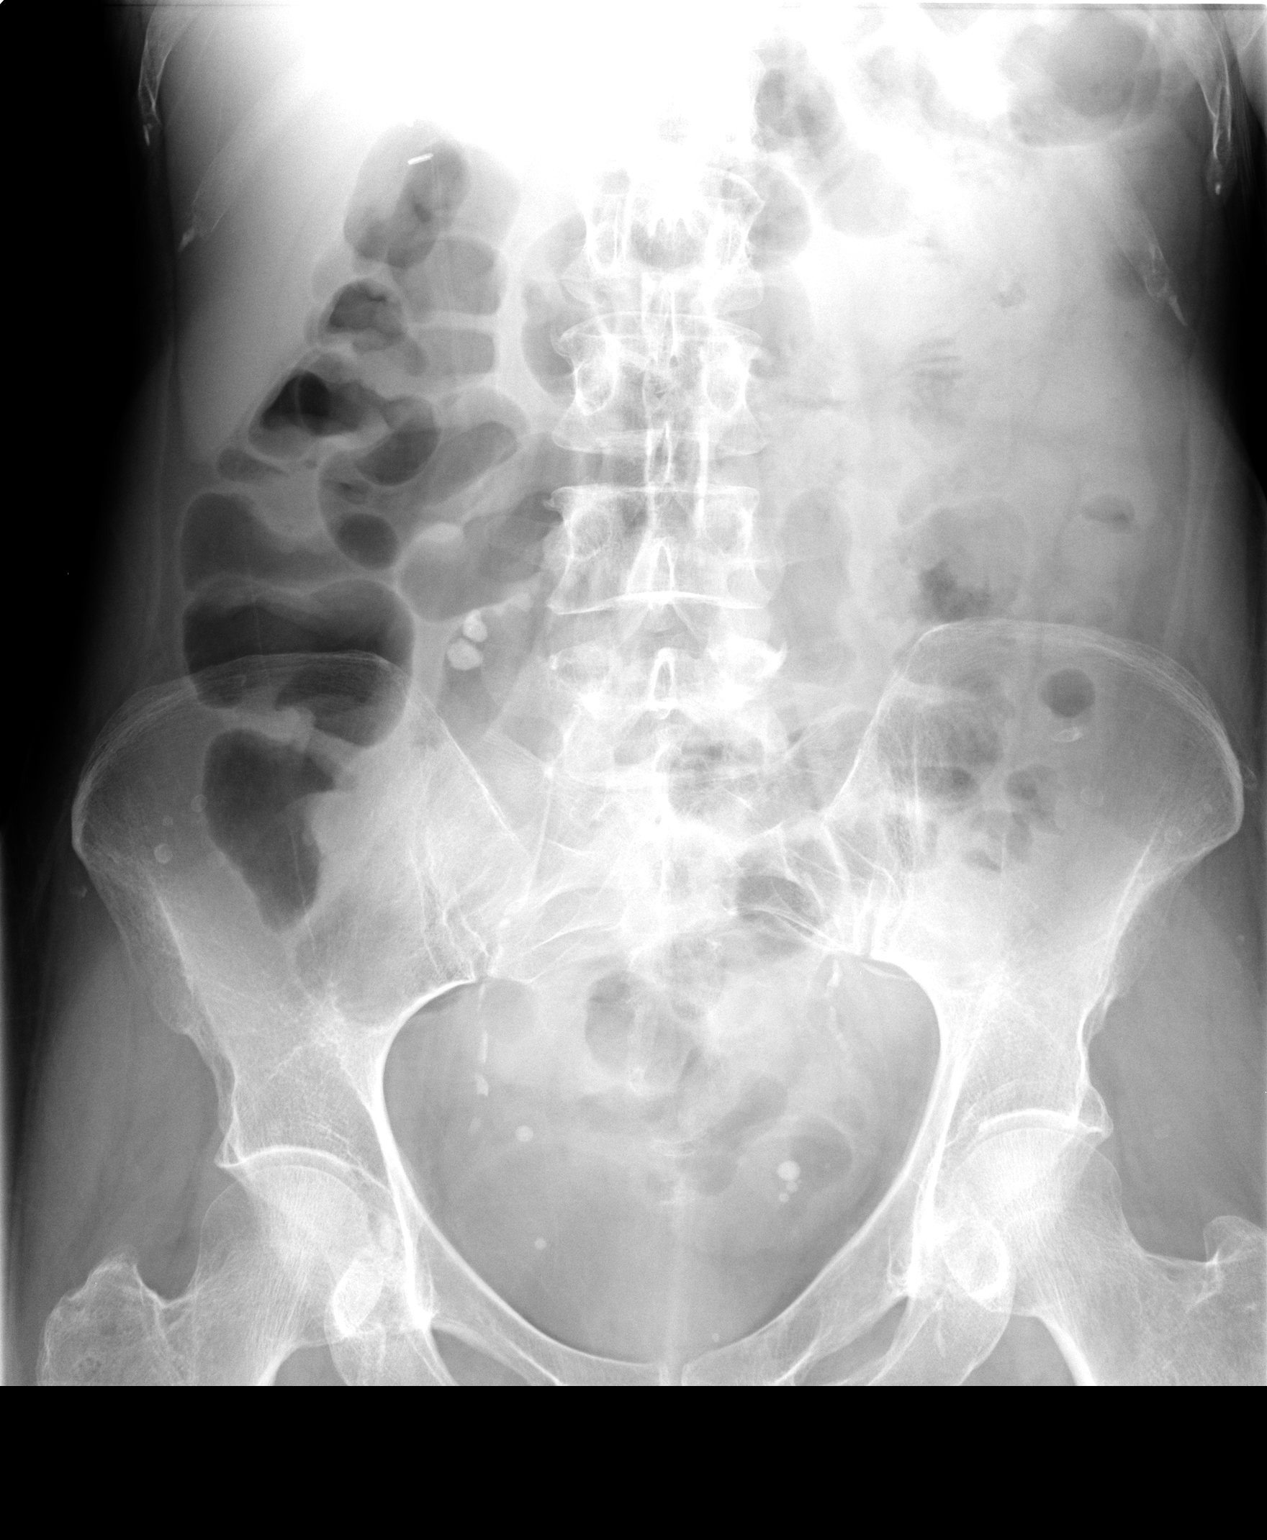

[1 of 1 positions shown; findings below may reference images not displayed]

FINDINGS: No evidence of acute bowel obstruction or significant
ileus.  Spine shows mild degenerative changes.  Calcified
phleboliths are identified.
IMPRESSION: No acute bowel obstruction.

## 2013-08-20 IMAGING — CR DG SHOULDER 2+V*L*
3 series · 3 of 3 positions shown · non-contrast
Comparison: None.

CLINICAL DATA: Left shoulder pain.

LEFT SHOULDER - 2+ VIEW

[view not recorded (1 of 3)]
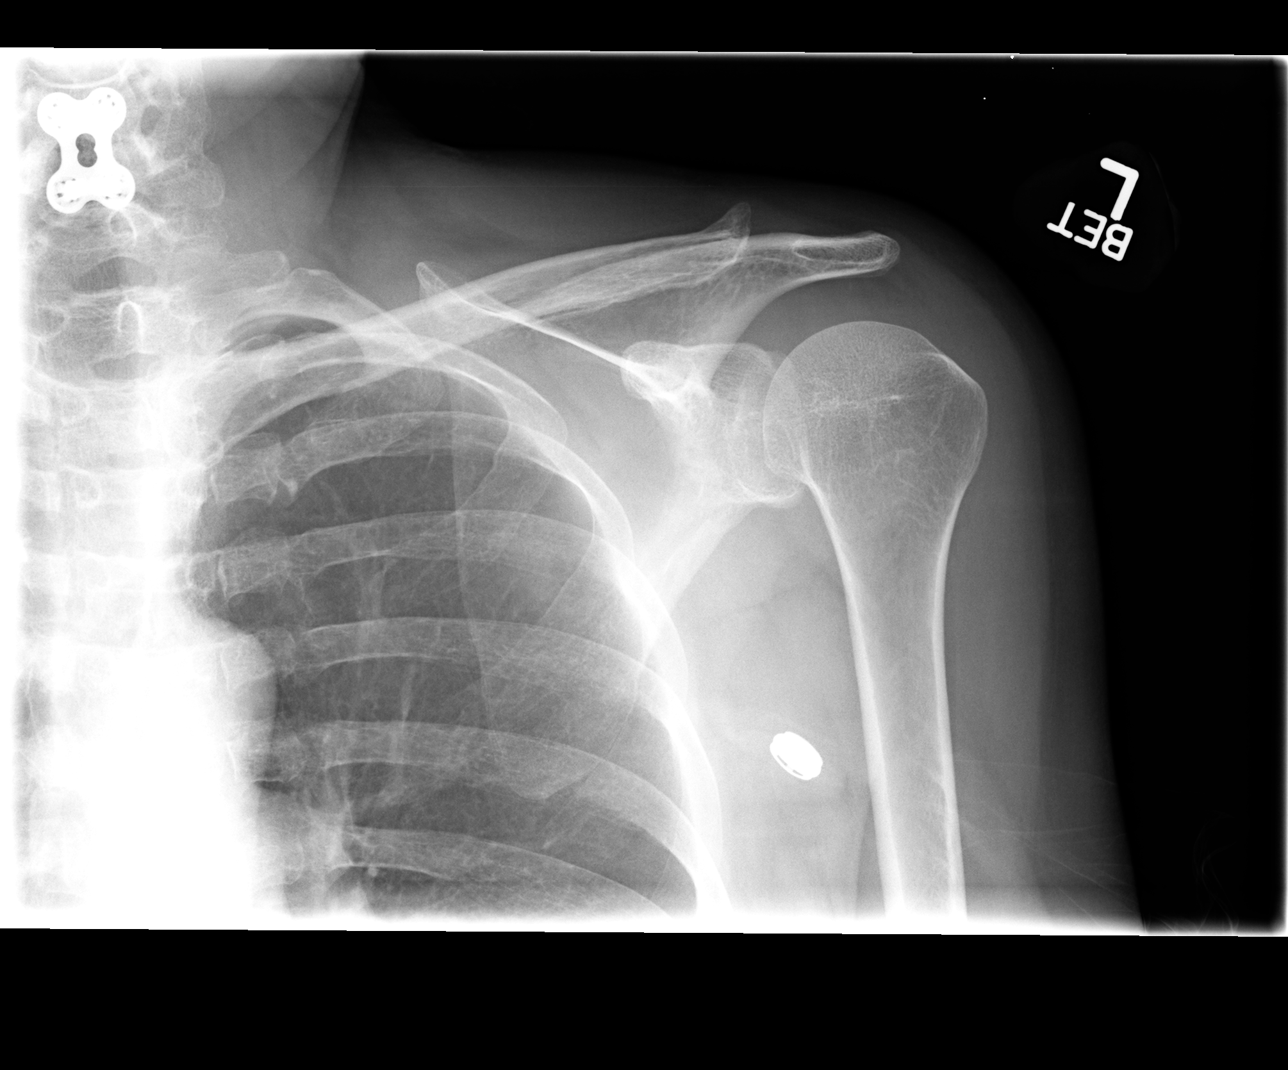

[view not recorded (2 of 3)]
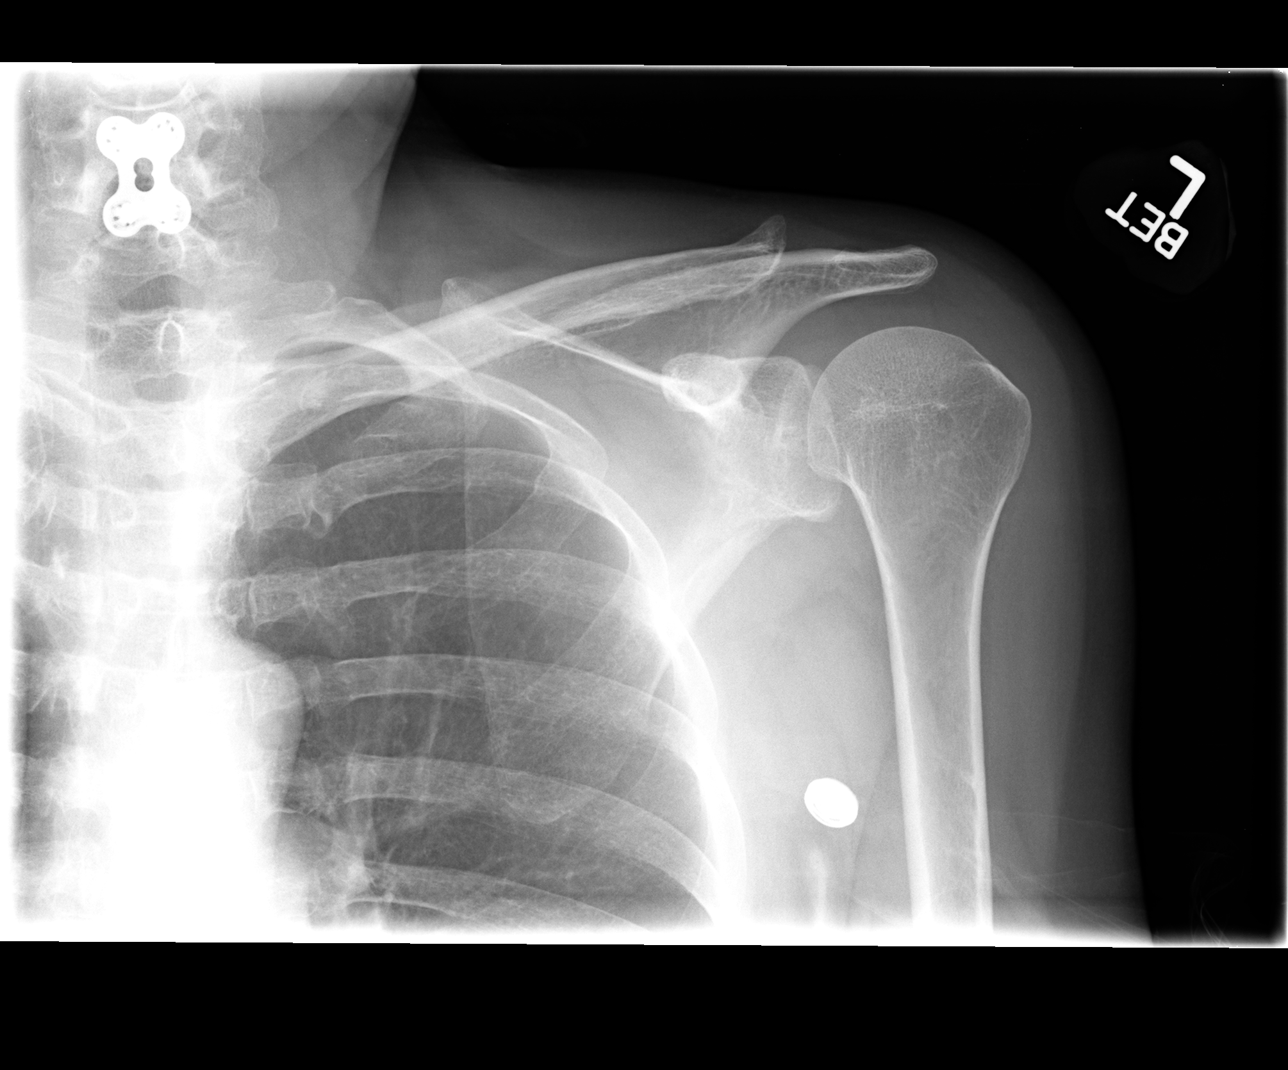

[view not recorded (3 of 3)]
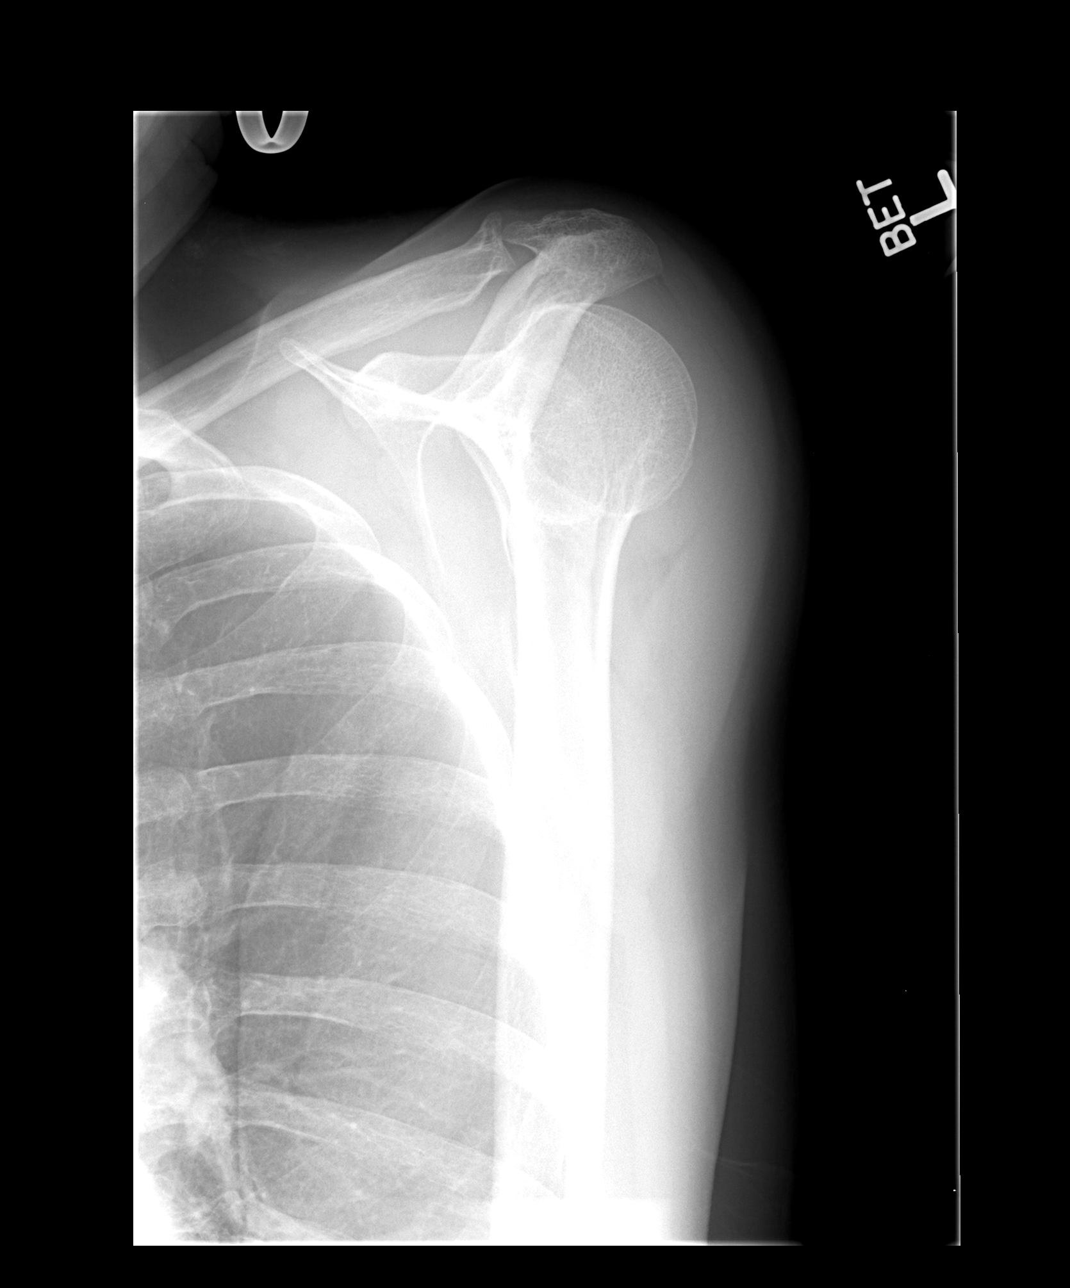

[3 of 3 positions shown; findings below may reference images not displayed]

FINDINGS: No acute fracture or dislocation identified.  The
glenohumeral joint shows normal alignment without significant
arthropathy.  No bony erosions or lesions are identified.  There is
some resorption of the distal clavicle with widening of the AC
joint. Appearance is suggestive of prior trauma or postsurgical
changes.
IMPRESSION: No acute findings.  Resorption of the distal clavicle with
appearance suggesting either prior injury or surgery.

## 2014-02-11 ENCOUNTER — Other Ambulatory Visit (HOSPITAL_COMMUNITY): Payer: Self-pay | Admitting: Orthopaedic Surgery

## 2014-02-11 DIAGNOSIS — IMO0002 Reserved for concepts with insufficient information to code with codable children: Secondary | ICD-10-CM

## 2014-02-11 DIAGNOSIS — M545 Low back pain: Secondary | ICD-10-CM

## 2014-02-12 ENCOUNTER — Ambulatory Visit (HOSPITAL_COMMUNITY)
Admission: RE | Admit: 2014-02-12 | Discharge: 2014-02-12 | Disposition: A | Discharge status: Home / Self Care | Payer: Medicaid Other | Source: Ambulatory Visit | Attending: Orthopaedic Surgery | Admitting: Orthopaedic Surgery

## 2014-02-12 DIAGNOSIS — M545 Low back pain, unspecified: Secondary | ICD-10-CM | POA: Insufficient documentation

## 2014-02-12 DIAGNOSIS — IMO0002 Reserved for concepts with insufficient information to code with codable children: Secondary | ICD-10-CM

## 2014-02-12 DIAGNOSIS — S32009A Unspecified fracture of unspecified lumbar vertebra, initial encounter for closed fracture: Secondary | ICD-10-CM | POA: Insufficient documentation

## 2014-02-12 DIAGNOSIS — W19XXXA Unspecified fall, initial encounter: Secondary | ICD-10-CM | POA: Insufficient documentation

## 2014-03-11 ENCOUNTER — Ambulatory Visit (HOSPITAL_COMMUNITY): Payer: Medicaid Other | Attending: Orthopaedic Surgery | Admitting: Physical Therapy

## 2014-03-18 ENCOUNTER — Ambulatory Visit (HOSPITAL_COMMUNITY)
Admission: RE | Admit: 2014-03-18 | Discharge: 2014-03-18 | Disposition: A | Discharge status: Home / Self Care | Payer: Medicaid Other | Source: Ambulatory Visit | Attending: Orthopaedic Surgery | Admitting: Orthopaedic Surgery

## 2014-03-18 DIAGNOSIS — IMO0001 Reserved for inherently not codable concepts without codable children: Secondary | ICD-10-CM | POA: Diagnosis present

## 2014-03-18 DIAGNOSIS — M5380 Other specified dorsopathies, site unspecified: Secondary | ICD-10-CM | POA: Insufficient documentation

## 2014-03-18 DIAGNOSIS — M539 Dorsopathy, unspecified: Secondary | ICD-10-CM | POA: Diagnosis not present

## 2014-03-18 DIAGNOSIS — R262 Difficulty in walking, not elsewhere classified: Secondary | ICD-10-CM | POA: Insufficient documentation

## 2014-03-18 DIAGNOSIS — M545 Low back pain, unspecified: Secondary | ICD-10-CM | POA: Diagnosis not present

## 2014-03-18 DIAGNOSIS — R269 Unspecified abnormalities of gait and mobility: Secondary | ICD-10-CM | POA: Diagnosis not present

## 2014-03-18 NOTE — Evaluation (Signed)
Physical Therapy Evaluation  Patient Details  Name: Deanna KerbsRhonda R Mara MRN: [id number] Date of Birth: Sep 19, 1955  Today's Date: 03/18/2014 Time: [id number] PT Time Calculation (min): 45 min    Charges: 1 evaluation          Visit#: 1 of 1  Assessment Diagnosis: Low back pain secondary to a compression fraction of lumbar spine followign a fall Next MD Visit: Hilda LiasKeeling Prior Therapy: no  Authorization: Medicaid    Authorization Visit#: 1 of 1   Past Medical History:  Past Medical History  Diagnosis Date  . Anxiety     use to see Psychiatrist   . Depression     previously took paxil in the past - no meds in years   . GERD (gastroesophageal reflux disease)   . Hyperlipidemia   . Hypertension   . Low back pain   . Chronic neck pain   . IBS (irritable bowel syndrome)     with constipation  . Migraines   . Bipolar disorder    Past Surgical History:  Past Surgical History  Procedure Laterality Date  . Cholecystectomy  2000  . C5-6 fusion  2001    Dr. Newell CoralNudelman  . States she had left shoulder surgery - cant remember when  and no record in [facility]      r shoulder surgery  . Total abdominal hysterectomy w/ bilateral salpingoophorectomy    . Abdominal hysterectomy      Subjective Symptoms/Limitations Symptoms: low back pain secondary to a lumbar compreassiopn fracture. "pinching in back Pertinent History: Patient has had low back pain for last 5 months following patient falling off the bed. Broke "knee bone in 2011" Fracture of L1 vertebral body.  How long can you sit comfortably?: 1 hour How long can you walk comfortably?: 20minutes.  Patient Stated Goals: decrease pain Pain Assessment Currently in Pain?: Yes Pain Score: 8  Pain Location: Back Pain Orientation: Posterior;Lower;Mid Pain Type: Chronic pain Pain Radiating Towards: Radiates down Lt and and Rt leg from back  Pain Onset: More than a month ago Pain Frequency: Constant Pain Relieving Factors: Pain medication  takes off the edge.  Effect of Pain on Daily Activities: bending over, sittign bed, twisting, walking hurts laying down, difficulty ssleeping.   Cognition/Observation Observation/Other Assessments Observations: Gait limited trunk rotation and hip rotation, hips and trunk move as one unit. Other Assessments: Posture:   Assessment Lumbar AROM Lumbar Flexion: 50% limited Lumbar Extension: 80% limited Lumbar - Right Side Bend: 80% limited Lumbar - Left Side Bend: 80% limited Lumbar - Right Rotation: 70% limited Lumbar - Left Rotation: 70% limited Lumbar Strength Lumbar Flexion: 2/5 Lumbar Extension: 2/5 Palpation Palpation: Excessive tenderness to light touch in lumbar spine.   Exercise/Treatments Abdominal drawing in maneuver 10x 3 seconds Bent leg lift 10x 3 seconds Lumbar rotation 10x Single knee to chest 10x 3 seconds 3D thoracic spine excursion 10x  Physical Therapy Assessment and Plan PT Assessment and Plan Clinical Impression Statement: Patient displays low back stiffness secondary to severe back pain with movements in all planes of motions secondary to a L1 compression fracture. patient also displayed severe trunk weakness resulting in instability and difficulty performing prolonged walking and spatient unable to sit >215minutes. Patient's numbeness and tingling did not increase throughout session. patient low back pain attributed to lumbar spine fracture and ensuing trunk weakness and mobility limitation for which this session focused on educatign the patient in a home exercise program to improve pain and symptoms independently. patient would benefit  from skilled phsyical therapy to decrease low back pain improve mobility and strength so patient can  could return to walking withotu pain, but patient denies future therapy due to limited insurance coverage and patient unable to take on financial costs.  Patient responded well to exercises with minor increase in pain but improved  mobility and improved gait.  Pt will benefit from skilled therapeutic intervention in order to improve on the following deficits: Abnormal gait;Decreased activity tolerance;Decreased coordination;Decreased strength;Difficulty walking;Pain;Impaired sensation;Impaired flexibility;Improper spinal/pelvic alignment;Increased muscle spasms;Decreased range of motion;Decreased mobility;Increased fascial restricitons;Improper body mechanics Rehab Potential: Fair Clinical Impairments Affecting Rehab Potential: severe pain for past 4 months PT Treatment/Interventions: Patient/family education;Therapeutic exercise;Therapeutic activities;Manual techniques PT Plan: Patient discharges home with HEP due to inability to afford cost of phsyical therapy.     Goals    Problem List Patient Active Problem List   Diagnosis Date Noted  . TEAR MEDIAL MENISCUS 09/07/2010  . SHOULDER PAIN, BILATERAL 01/07/2010  . HYPERLIPIDEMIA 03/13/2007  . DISORDER, BIPOLAR NOS 03/13/2007  . ANXIETY 03/13/2007  . DEPRESSION 03/13/2007  . HYPERTENSION 03/13/2007  . GERD 03/13/2007  . CONSTIPATION 03/13/2007  . IBS 03/13/2007  . NECK PAIN, CHRONIC 03/13/2007  . LOW BACK PAIN 03/13/2007  . MIGRAINES, HX OF 03/13/2007    PT - End of Session Activity Tolerance: Patient tolerated treatment well;Patient limited by pain General Behavior During Therapy: WFL for tasks assessed/performed PT Plan of Care PT Home Exercise Plan: 3D thoracic spine matrix, Abdominal drawing in manuever, single leg bent leg lift in hooklaying, Lumbar rotation, single knee to chest stretch PT Patient Instructions: Patient educated in exercise progression and performance (see handout).  Consulted and Agree with Plan of Care: Patient  GP    Doyne Keel 03/18/2014, 10:31 AM  Physician Documentation Your signature is required to indicate approval of the treatment plan as stated above.  Please sign and either send electronically or make a copy of this  report for your files and return this physician signed original.   Please mark one 1.__approve of plan  2. ___approve of plan with the following conditions.   ______________________________                                                          _____________________ Physician Signature                                                                                                             Date

## 2015-05-14 ENCOUNTER — Encounter (HOSPITAL_COMMUNITY): Payer: Self-pay | Admitting: *Deleted

## 2015-05-14 ENCOUNTER — Emergency Department (HOSPITAL_COMMUNITY)
Admission: EM | Admit: 2015-05-14 | Discharge: 2015-05-14 | Disposition: A | Discharge status: Home / Self Care | Payer: Medicaid Other | Attending: Emergency Medicine | Admitting: Emergency Medicine

## 2015-05-14 DIAGNOSIS — R51 Headache: Secondary | ICD-10-CM | POA: Insufficient documentation

## 2015-05-14 DIAGNOSIS — Z8719 Personal history of other diseases of the digestive system: Secondary | ICD-10-CM | POA: Insufficient documentation

## 2015-05-14 DIAGNOSIS — Z8639 Personal history of other endocrine, nutritional and metabolic disease: Secondary | ICD-10-CM | POA: Insufficient documentation

## 2015-05-14 DIAGNOSIS — Z8659 Personal history of other mental and behavioral disorders: Secondary | ICD-10-CM | POA: Insufficient documentation

## 2015-05-14 DIAGNOSIS — I10 Essential (primary) hypertension: Secondary | ICD-10-CM | POA: Diagnosis not present

## 2015-05-14 DIAGNOSIS — R519 Headache, unspecified: Secondary | ICD-10-CM

## 2015-05-14 DIAGNOSIS — Z720 Tobacco use: Secondary | ICD-10-CM | POA: Diagnosis not present

## 2015-05-14 DIAGNOSIS — G8929 Other chronic pain: Secondary | ICD-10-CM | POA: Insufficient documentation

## 2015-05-14 DIAGNOSIS — R112 Nausea with vomiting, unspecified: Secondary | ICD-10-CM | POA: Insufficient documentation

## 2015-05-14 MED ORDER — PROCHLORPERAZINE EDISYLATE 5 MG/ML IJ SOLN
10.0000 mg | Freq: Once | INTRAMUSCULAR | Status: AC
  Administered 2015-05-14: 10 mg via INTRAVENOUS
  Filled 2015-05-14: qty 2

## 2015-05-14 MED ORDER — DEXAMETHASONE SODIUM PHOSPHATE 10 MG/ML IJ SOLN
10.0000 mg | Freq: Once | INTRAMUSCULAR | Status: AC
  Administered 2015-05-14: 10 mg via INTRAVENOUS
  Filled 2015-05-14: qty 1

## 2015-05-14 MED ORDER — DIPHENHYDRAMINE HCL 50 MG/ML IJ SOLN
25.0000 mg | Freq: Once | INTRAMUSCULAR | Status: AC
  Administered 2015-05-14: 25 mg via INTRAVENOUS
  Filled 2015-05-14: qty 1

## 2015-05-14 MED ORDER — SODIUM CHLORIDE 0.9 % IV SOLN
1000.0000 mL | Freq: Once | INTRAVENOUS | Status: AC
  Administered 2015-05-14: 1000 mL via INTRAVENOUS

## 2015-05-14 MED ORDER — SODIUM CHLORIDE 0.9 % IV SOLN
1000.0000 mL | INTRAVENOUS | Status: DC
  Administered 2015-05-14: 1000 mL via INTRAVENOUS

## 2015-05-14 MED ORDER — KETOROLAC TROMETHAMINE 30 MG/ML IJ SOLN
30.0000 mg | Freq: Once | INTRAMUSCULAR | Status: AC
  Administered 2015-05-14: 30 mg via INTRAVENOUS
  Filled 2015-05-14: qty 1

## 2015-05-14 NOTE — ED Notes (Signed)
Discharge instructions given, pt demonstrated teach back and verbal understanding. No concerns voiced.  

## 2015-05-14 NOTE — ED Notes (Signed)
Migraine began at 1230 today with vomiting x 3 today. States back pain as well, with hx of chronic back pain requiring surgery.

## 2015-05-14 NOTE — ED Provider Notes (Signed)
CSN: [id number]     Arrival date & time 05/14/15  1518 History   First MD Initiated Contact with Patient 05/14/15 1624     Chief Complaint  Patient presents with  . Migraine     (Consider location/radiation/quality/duration/timing/severity/associated sxs/prior Treatment) Patient is a 59 y.o. female presenting with migraines.  Migraine This is a recurrent problem. The current episode started 3 to 5 hours ago. The problem occurs constantly. The problem has not changed since onset.Associated symptoms include headaches. Pertinent negatives include no chest pain. Nothing aggravates the symptoms. Nothing relieves the symptoms. She has tried nothing for the symptoms.    Past Medical History  Diagnosis Date  . Anxiety     use to see Psychiatrist   . Depression     previously took paxil in the past - no meds in years   . GERD (gastroesophageal reflux disease)   . Hyperlipidemia   . Hypertension   . Low back pain   . Chronic neck pain   . IBS (irritable bowel syndrome)     with constipation  . Migraines   . Bipolar disorder    Past Surgical History  Procedure Laterality Date  . Cholecystectomy  2000  . C5-6 fusion  2001    [name]  . States she had left shoulder surgery - cant remember when  and no record in [facility]      r shoulder surgery  . Total abdominal hysterectomy w/ bilateral salpingoophorectomy    . Abdominal hysterectomy    . Back surgery     Family History  Problem Relation Age of Onset  . Cancer Mother     lung    Social History  Substance Use Topics  . Smoking status: Current Every Day Smoker -- 1.00 packs/day    Types: Cigarettes  . Smokeless tobacco: None  . Alcohol Use: No   OB History    No data available     Review of Systems  Constitutional: Negative for fever and chills.  Eyes: Negative for pain.  Cardiovascular: Negative for chest pain.  Gastrointestinal: Positive for nausea and vomiting.  Endocrine: Negative for polydipsia and  polyuria.  Genitourinary: Negative for urgency and pelvic pain.  Neurological: Positive for headaches.  All other systems reviewed and are negative.     Allergies  Hydrocodone; Ibuprofen; Propoxyphene n-acetaminophen; and Robaxin  Home Medications   Prior to Admission medications   Not on File   BP 153/84 mmHg  Pulse 82  Temp(Src) 98.1 F (36.7 C) (Oral)  Resp 16  Ht  (1.651 m)  Wt 136 lb 14.4 oz (62.097 kg)  BMI 22.78 kg/m2  SpO2 100% Physical Exam  Constitutional: She is oriented to person, place, and time. She appears well-developed and well-nourished.  HENT:  Head: Normocephalic and atraumatic.  Eyes: Conjunctivae and EOM are normal. Right eye exhibits no discharge. Left eye exhibits no discharge.  Cardiovascular: Normal rate and regular rhythm.   Pulmonary/Chest: Effort normal and breath sounds normal. No respiratory distress.  Abdominal: Soft. She exhibits no distension. There is no tenderness. There is no rebound.  Musculoskeletal: Normal range of motion. She exhibits no edema or tenderness.  Neurological: She is alert and oriented to person, place, and time.  No altered mental status, able to give full seemingly accurate history.  Face is symmetric, EOM's intact, pupils equal and reactive, vision intact, tongue and uvula midline without deviation Upper and Lower extremity motor 5/5, intact pain perception in distal extremities, 2+ reflexes  in biceps, patella and achilles tendons. Finger to nose normal, heel to shin normal.  Skin: Skin is warm and dry.  Nursing note and vitals reviewed.   ED Course  Procedures (including critical care time) Labs Review Labs Reviewed - No data to display  Imaging Review No results found. I have personally reviewed and evaluated these images and lab results as part of my medical decision-making.   EKG Interpretation None      MDM   Final diagnoses:  Nonintractable headache, unspecified chronicity pattern,  unspecified headache type   Migraine, similar to prior but lasting longer. W/ n/v of stomach contents as well. Exam benign. Doubt secondary cause, will tx appropriately. Also with acute exacerbation of chronic back pain, nothing new. No needs.   On reevaluation, headache improved. Neuro exam unchanged. Stable for dc with PCP follow up.   I have personally and contemperaneously reviewed labs and imaging and used in my decision making as above.   A medical screening exam was performed and I feel the patient has had an appropriate workup for their chief complaint at this time and likelihood of emergent condition existing is low. They have been counseled on decision, discharge, follow up and which symptoms necessitate immediate return to the emergency department. They or their family verbally stated understanding and agreement with plan and discharged in stable condition.      Marily Memos, MD 05/14/15 2145

## 2016-03-11 ENCOUNTER — Emergency Department (HOSPITAL_COMMUNITY)
Admission: EM | Admit: 2016-03-11 | Discharge: 2016-03-11 | Disposition: A | Discharge status: Home / Self Care | Payer: Medicaid Other | Attending: Emergency Medicine | Admitting: Emergency Medicine

## 2016-03-11 ENCOUNTER — Encounter (HOSPITAL_COMMUNITY): Payer: Self-pay | Admitting: Emergency Medicine

## 2016-03-11 DIAGNOSIS — I10 Essential (primary) hypertension: Secondary | ICD-10-CM | POA: Insufficient documentation

## 2016-03-11 DIAGNOSIS — F17210 Nicotine dependence, cigarettes, uncomplicated: Secondary | ICD-10-CM | POA: Insufficient documentation

## 2016-03-11 DIAGNOSIS — N12 Tubulo-interstitial nephritis, not specified as acute or chronic: Secondary | ICD-10-CM | POA: Insufficient documentation

## 2016-03-11 DIAGNOSIS — R350 Frequency of micturition: Secondary | ICD-10-CM | POA: Diagnosis present

## 2016-03-11 LAB — URINALYSIS, ROUTINE W REFLEX MICROSCOPIC
Bilirubin Urine: NEGATIVE
GLUCOSE, UA: NEGATIVE mg/dL
KETONES UR: NEGATIVE mg/dL
Nitrite: POSITIVE — AB
PH: 6 (ref 5.0–8.0)
PROTEIN: 100 mg/dL — AB
Specific Gravity, Urine: 1.015 (ref 1.005–1.030)

## 2016-03-11 LAB — URINE MICROSCOPIC-ADD ON

## 2016-03-11 MED ORDER — LIDOCAINE HCL (PF) 1 % IJ SOLN
2.1000 mL | Freq: Once | INTRAMUSCULAR | Status: AC
  Administered 2016-03-11: 2.1 mL
  Filled 2016-03-11: qty 5

## 2016-03-11 MED ORDER — CEPHALEXIN 500 MG PO CAPS: 500.0000 mg | ORAL_CAPSULE | Freq: Four times a day (QID) | ORAL | Status: DC

## 2016-03-11 MED ORDER — ONDANSETRON 8 MG PO TBDP
8.0000 mg | ORAL_TABLET | Freq: Once | ORAL | Status: AC
  Administered 2016-03-11: 8 mg via ORAL
  Filled 2016-03-11: qty 1

## 2016-03-11 MED ORDER — CEFTRIAXONE SODIUM 1 G IJ SOLR
1.0000 g | Freq: Once | INTRAMUSCULAR | Status: AC
  Administered 2016-03-11: 1 g via INTRAMUSCULAR
  Filled 2016-03-11: qty 10

## 2016-03-11 MED ORDER — OXYCODONE-ACETAMINOPHEN 5-325 MG PO TABS
1.0000 | ORAL_TABLET | Freq: Once | ORAL | Status: AC
  Administered 2016-03-11: 1 via ORAL
  Filled 2016-03-11: qty 1

## 2016-03-11 NOTE — ED Notes (Signed)
Pt made aware to return if symptoms worsen or if any life threatening symptoms occur.   

## 2016-03-11 NOTE — ED Notes (Signed)
C/o pressure to lower abdomen when voiding and blood in urine.  Rates pain 10/10.

## 2016-03-11 NOTE — Discharge Instructions (Signed)
Pyelonephritis, Adult °Pyelonephritis is a kidney infection. The kidneys are organs that help clean your blood by moving waste out of your blood and into your pee (urine). This infection can happen quickly, or it can last for a long time. In most cases, it clears up with treatment and does not cause other problems. °HOME CARE °Medicines °· Take over-the-counter and prescription medicines only as told by your doctor. °· Take your antibiotic medicine as told by your doctor. Do not stop taking the medicine even if you start to feel better. °General Instructions °· Drink enough fluid to keep your pee clear or pale yellow. °· Avoid caffeine, tea, and carbonated drinks. °· Pee (urinate) often. Avoid holding in pee for long periods of time. °· Pee before and after sex. °· After pooping (having a bowel movement), women should wipe from front to back. Use each tissue only once. °· Keep all follow-up visits as told by your doctor. This is important. °GET HELP IF: °· You do not feel better after 2 days. °· Your symptoms get worse. °· You have a fever. °GET HELP RIGHT AWAY IF: °· You cannot take your medicine or drink fluids as told. °· You have chills and shaking. °· You throw up (vomit). °· You have very bad pain in your side (flank) or back. °· You feel very weak or you pass out (faint). °  °This information is not intended to replace advice given to you by your health care provider. Make sure you discuss any questions you have with your health care provider. °  °Document Released: 09/14/2004 Document Revised: 04/28/2015 Document Reviewed: 11/30/2014 °Elsevier Interactive Patient Education ©2016 [employer] ° °

## 2016-03-12 NOTE — ED Provider Notes (Signed)
[facility]EMER[facility]Y DEPT Provider Note   CSN: [id number] Arrival date & time: 03/11/16  1413  First Provider Contact:  First MD Initiated Contact with Patient 03/11/16 1450        History   Chief Complaint Chief Complaint  Patient presents with  . Urinary Tract Infection    HPI Deanna Clark is a 60 y.o. female.  HPI   Deanna Clark is a 60 y.o. female who presents to the Emergency Department complaining of bilateral back pain, lower abdominal pressure with voiding, urinary frequency and bloody urine.  Symptoms have been present for 4 days.  Notices blood post void.  She also complains of nausea without vomiting.  She also denies fever, chills, vaginal bleeding or discharge.  Nothing makes symptoms better.    Past Medical History:  Diagnosis Date  . Anxiety    use to see Psychiatrist   . Bipolar disorder (HCC)   . Chronic neck pain   . Depression    previously took paxil in the past - no meds in years   . GERD (gastroesophageal reflux disease)   . Hyperlipidemia   . Hypertension   . IBS (irritable bowel syndrome)    with constipation  . Low back pain   . Migraines     Patient Active Problem List   Diagnosis Date Noted  . TEAR MEDIAL MENISCUS 09/07/2010  . SHOULDER PAIN, BILATERAL 01/07/2010  . HYPERLIPIDEMIA 03/13/2007  . DISORDER, BIPOLAR NOS 03/13/2007  . ANXIETY 03/13/2007  . DEPRESSION 03/13/2007  . HYPERTENSION 03/13/2007  . GERD 03/13/2007  . CONSTIPATION 03/13/2007  . IBS 03/13/2007  . NECK PAIN, CHRONIC 03/13/2007  . LOW BACK PAIN 03/13/2007  . MIGRAINES, HX OF 03/13/2007    Past Surgical History:  Procedure Laterality Date  . ABDOMINAL HYSTERECTOMY    . BACK SURGERY    . C5-6 fusion  2001   [name]  . CHOLECYSTECTOMY  2000  . States she had left shoulder surgery - cant remember when  and no [facility] in [facility]     r shoulder surgery  . TOTAL ABDOMINAL HYSTERECTOMY W/ BILATERAL SALPINGOOPHORECTOMY      OB History    No data  available       Home Medications    Prior to Admission medications   Medication Sig Start Date End Date Taking? Authorizing Provider  cephALEXin (KEFLEX) 500 MG capsule Take 1 capsule (500 mg total) by mouth 4 (four) times daily. For 7 days 03/11/16   Pauline Aus, PA-C    Family History Family History  Problem Relation Age of Onset  . Cancer Mother     lung     Social History Social History  Substance Use Topics  . Smoking status: Current Every Day Smoker    Packs/day: 1.00    Types: Cigarettes  . Smokeless tobacco: Not on file  . Alcohol use No     Allergies   Hydrocodone; Ibuprofen; Propoxyphene n-acetaminophen; and Robaxin [methocarbamol]   Review of Systems Review of Systems  Constitutional: Negative for activity change, appetite change, chills and fever.  Respiratory: Negative for chest tightness and shortness of breath.   Gastrointestinal: Positive for abdominal pain (suprapubic tenderness). Negative for nausea and vomiting.  Genitourinary: Positive for dysuria, frequency and urgency. Negative for decreased urine volume, difficulty urinating, flank pain, hematuria, vaginal bleeding and vaginal discharge.  Musculoskeletal: Positive for back pain.  Skin: Negative for rash.  Neurological: Negative for dizziness, weakness and numbness.  Hematological: Negative for adenopathy.  Psychiatric/Behavioral: Negative for confusion.  All other systems reviewed and are negative.    Physical Exam Updated Vital Signs BP 110/95   Pulse 69   Temp 98.3 F (36.8 C) (Oral)   Resp 18   Ht 5\' 4"  (1.626 m)   Wt 53.3 kg   SpO2 94%   BMI 20.15 kg/m   Physical Exam  Constitutional: She is oriented to person, place, and time. She appears well-developed and well-nourished. No distress.  HENT:  Head: Normocephalic and atraumatic.  Cardiovascular: Normal rate, regular rhythm, normal heart sounds and intact distal pulses.   No murmur heard. Pulmonary/Chest: Effort normal  and breath sounds normal. No respiratory distress. She has no wheezes. She has no rales.  Abdominal: Soft. Normal appearance. She exhibits no mass. There is no hepatosplenomegaly. There is tenderness in the suprapubic area. There is CVA tenderness. There is no rigidity, no rebound and no tenderness at McBurney's point.  Mild ttp of the suprapubic region.  Mild bilateral CVA tenderness  Musculoskeletal: Normal range of motion. She exhibits no edema.  Neurological: She is alert and oriented to person, place, and time. Coordination normal.  Skin: Skin is warm and dry. No rash noted.  Nursing note and vitals reviewed.    ED Treatments / Results  Labs (all labs ordered are listed, but only abnormal results are displayed) Labs Reviewed  URINALYSIS, ROUTINE W REFLEX MICROSCOPIC (NOT AT [facility]) - Abnormal; Notable for the following:       Result Value   Color, Urine BROWN (*)    APPearance CLOUDY (*)    Hgb urine dipstick LARGE (*)    Protein, ur 100 (*)    Nitrite POSITIVE (*)    Leukocytes, UA SMALL (*)    All other components within normal limits  URINE MICROSCOPIC-ADD ON - Abnormal; Notable for the following:    Squamous Epithelial / LPF 0-5 (*)    Bacteria, UA MANY (*)    Casts GRANULAR CAST (*)    All other components within normal limits  URINE CULTURE    EKG  EKG Interpretation None       Radiology No results found.  Procedures Procedures (including critical care time)  Medications Ordered in ED Medications  oxyCODONE-acetaminophen (PERCOCET/ROXICET) 5-325 MG per tablet 1 tablet (1 tablet Oral Given 03/11/16 1529)  ondansetron (ZOFRAN-ODT) disintegrating tablet 8 mg (8 mg Oral Given 03/11/16 1529)  cefTRIAXone (ROCEPHIN) injection 1 g (1 g Intramuscular Given 03/11/16 1634)  lidocaine (PF) (XYLOCAINE) 1 % injection 2.1 mL (2.1 mLs Other Given 03/11/16 1635)     Initial Impression / Assessment and Plan / ED Course  I have reviewed the triage vital signs and the nursing  notes.  Pertinent labs & imaging results that were available during my care of the patient were reviewed by me and considered in my medical decision making (see chart for details).  Clinical Course    Pt is ambulatory, feeling better after medications.  No fever, chills, vomiting.  Likely pyelonephritis.  Appears stable for d/c.  Urine culture pending.  IM rocephin given here as well as Rx for Keflex.  Pt agrees to ER return for increasing pain, fever, vomiting.   Final Clinical Impressions(s) / ED Diagnoses   Final diagnoses:  Pyelonephritis    New Prescriptions Discharge Medication List as of 03/11/2016  5:07 PM    START taking these medications   Details  cephALEXin (KEFLEX) 500 MG capsule Take 1 capsule (500 mg total) by mouth 4 (four) times  daily. For 7 days, Starting 03/11/2016, Until Discontinued, Print         Pauline Aus, Cordelia Poche 03/12/16 2228    Lavera Guise, MD 03/13/16 1031

## 2016-03-13 LAB — URINE CULTURE

## 2016-03-14 ENCOUNTER — Telehealth (HOSPITAL_BASED_OUTPATIENT_CLINIC_OR_DEPARTMENT_OTHER): Payer: Self-pay | Admitting: Emergency Medicine

## 2016-03-14 NOTE — Telephone Encounter (Signed)
Post ED Visit - Positive Culture Follow-up  Culture report reviewed by antimicrobial stewardship pharmacist:  []  Enzo Bi, Pharm.D. []  Celedonio Miyamoto, Pharm.D., BCPS []  Garvin Fila, Pharm.D. []  Georgina Pillion, Pharm.D., BCPS []  [place], [address].D., BCPS, AAHIVP [x]  Estella Husk, Pharm.D., BCPS, AAHIVP []  Tennis Must, Pharm.D. []  Sherle Poe, [place].D.  Positive urine culture Treated with cephalexin, organism sensitive to the same and no further patient follow-up is required at this time.  Berle Mull 03/14/2016, 11:18 AM

## 2016-04-04 ENCOUNTER — Emergency Department (HOSPITAL_COMMUNITY)
Admission: EM | Admit: 2016-04-04 | Discharge: 2016-04-04 | Disposition: A | Discharge status: Home / Self Care | Payer: Medicaid Other | Attending: Emergency Medicine | Admitting: Emergency Medicine

## 2016-04-04 ENCOUNTER — Encounter (HOSPITAL_COMMUNITY): Payer: Self-pay | Admitting: *Deleted

## 2016-04-04 DIAGNOSIS — F17210 Nicotine dependence, cigarettes, uncomplicated: Secondary | ICD-10-CM | POA: Insufficient documentation

## 2016-04-04 DIAGNOSIS — R51 Headache: Secondary | ICD-10-CM | POA: Diagnosis present

## 2016-04-04 DIAGNOSIS — G43909 Migraine, unspecified, not intractable, without status migrainosus: Secondary | ICD-10-CM

## 2016-04-04 DIAGNOSIS — I10 Essential (primary) hypertension: Secondary | ICD-10-CM | POA: Insufficient documentation

## 2016-04-04 MED ORDER — METOCLOPRAMIDE HCL 5 MG/ML IJ SOLN
10.0000 mg | Freq: Once | INTRAMUSCULAR | Status: AC
  Administered 2016-04-04: 10 mg via INTRAMUSCULAR
  Filled 2016-04-04: qty 2

## 2016-04-04 MED ORDER — DIPHENHYDRAMINE HCL 25 MG PO CAPS
25.0000 mg | ORAL_CAPSULE | Freq: Once | ORAL | Status: AC
  Administered 2016-04-04: 25 mg via ORAL
  Filled 2016-04-04: qty 1

## 2016-04-04 MED ORDER — KETOROLAC TROMETHAMINE 60 MG/2ML IM SOLN
60.0000 mg | Freq: Once | INTRAMUSCULAR | Status: AC
  Administered 2016-04-04: 60 mg via INTRAMUSCULAR
  Filled 2016-04-04: qty 2

## 2016-04-04 NOTE — ED Triage Notes (Signed)
Pt comes in with a headache starting today. Pt states she has hx of migraine. She states she has been under a lot of stress with a close friend dying recently. Denies any uni-lateral weakness. VS stable in triage. Pt has light and sound sensitivity.

## 2016-04-04 NOTE — Discharge Instructions (Signed)
You can contact one of the providers listed to establish primary care.

## 2016-04-04 NOTE — ED Provider Notes (Signed)
[facility]-EM[facility]Y DEPT Provider Note   CSN: [id number] Arrival date & time: 04/04/16  1722     History   Chief Complaint Chief Complaint  Patient presents with  . Headache    HPI Deanna Clark is a 60 y.o. female.  HPI  Deanna Clark is a 60 y.o. female who presents to the Emergency Department complaining of Diffuse frontal headache of gradual onset earlier today. She reports history of migraines and states the pain feels similar. She describes a sharp throbbing sensation to her temples, for it, and behind both eyes. She states that she has been under a lot of stress recently due to the death of a close friend. She also reports sensitivity to sound and light in associated nausea without vomiting. She states that she does not have any medications at home to take for her headaches, but usually takes hydrocodone. She denies chest pain, shortness of breath, neck pain numbness or weakness to the extremities.   Past Medical History:  Diagnosis Date  . Anxiety    use to see Psychiatrist   . Bipolar disorder (HCC)   . Chronic neck pain   . Depression    previously took paxil in the past - no meds in years   . GERD (gastroesophageal reflux disease)   . Hyperlipidemia   . Hypertension   . IBS (irritable bowel syndrome)    with constipation  . Low back pain   . Migraines     Patient Active Problem List   Diagnosis Date Noted  . TEAR MEDIAL MENISCUS 09/07/2010  . SHOULDER PAIN, BILATERAL 01/07/2010  . HYPERLIPIDEMIA 03/13/2007  . DISORDER, BIPOLAR NOS 03/13/2007  . ANXIETY 03/13/2007  . DEPRESSION 03/13/2007  . HYPERTENSION 03/13/2007  . GERD 03/13/2007  . CONSTIPATION 03/13/2007  . IBS 03/13/2007  . NECK PAIN, CHRONIC 03/13/2007  . LOW BACK PAIN 03/13/2007  . MIGRAINES, HX OF 03/13/2007    Past Surgical History:  Procedure Laterality Date  . ABDOMINAL HYSTERECTOMY    . BACK SURGERY    . C5-6 fusion  2001   Dr. Newell CoralNudelman  . CHOLECYSTECTOMY  2000  . SHOULDER  SURGERY    . States she had left shoulder surgery - cant remember when  and [facility]     [facility] shoulder [facility]  . TOTAL ABDOMINAL HYSTERECTOMY W/ BILATERAL SALPINGOOPHORECTOMY      OB History    No data available       Home Medications    Prior to Admission medications   Medication Sig Start Date End Date Taking? Authorizing Provider  cephALEXin (KEFLEX) 500 MG capsule Take 1 capsule (500 mg total) by mouth 4 (four) times daily. For 7 days 03/11/16   Pauline Ausammy Jantzen Pilger, PA-C    Family History Family History  Problem Relation Age of Onset  . Cancer Mother     lung     Social History Social History  Substance Use Topics  . Smoking status: Current Every Day Smoker    Packs/day: 1.00    Types: Cigarettes  . Smokeless tobacco: Never Used  . Alcohol use No     Allergies   Hydrocodone; Ibuprofen; Propoxyphene n-acetaminophen; and Robaxin [methocarbamol]   Review of Systems Review of Systems  Constitutional: Negative for activity change, appetite change and fever.  HENT: Negative for facial swelling and trouble swallowing.   Eyes: Positive for photophobia. Negative for pain and visual disturbance.  Respiratory: Negative for shortness of breath.   Cardiovascular: Negative for chest  pain.  Gastrointestinal: Negative for nausea and vomiting.  Musculoskeletal: Negative for neck pain and neck stiffness.  Skin: Negative for rash and wound.  Neurological: Positive for headaches. Negative for dizziness, facial asymmetry, speech difficulty, weakness and numbness.  Psychiatric/Behavioral: Negative for confusion and decreased concentration.  All other systems reviewed and are negative.    Physical Exam Updated Vital Signs BP 112/74 (BP Location: Right Arm)   Pulse 87   Temp 98.8 F (37.1 C) (Oral)   Resp 16   Ht 5\' 4"  (1.626 m)   Wt 53.1 kg   SpO2 98%   BMI 20.08 kg/m   Physical Exam  Constitutional: She is oriented to person, place, and time. She appears  well-developed and well-nourished. No distress.  HENT:  Head: Normocephalic and atraumatic.  Mouth/Throat: Oropharynx is clear and moist.  Eyes: EOM are normal. Pupils are equal, round, and reactive to light.  Neck: Normal range of motion and phonation normal. Neck supple. No spinous process tenderness and no muscular tenderness present. No neck rigidity. No Kernig's sign noted.  Cardiovascular: Normal rate, regular rhythm, normal heart sounds and intact distal pulses.   No murmur heard. Pulmonary/Chest: Effort normal and breath sounds normal. No respiratory distress.  Abdominal: Soft. She exhibits no distension. There is no tenderness. There is no guarding.  Musculoskeletal: Normal range of motion.  Neurological: She is alert and oriented to person, place, and time. She has normal strength. No cranial nerve deficit or sensory deficit. She exhibits normal muscle tone. Coordination and gait normal. GCS eye subscore is 4. GCS verbal subscore is 5. GCS motor subscore is 6.  Reflex Scores:      Tricep reflexes are 2+ on the right side and 2+ on the left side.      Bicep reflexes are 2+ on the right side and 2+ on the left side. Skin: Skin is warm and dry.  Psychiatric: She has a normal mood and affect.  Nursing note and vitals reviewed.    ED Treatments / Results  Labs (all labs ordered are listed, but only abnormal results are displayed) Labs Reviewed - No data to display  EKG  EKG Interpretation None       Radiology No results found.  Procedures Procedures (including critical care time)  Medications Ordered in ED Medications  ketorolac (TORADOL) injection 60 mg (not administered)  diphenhydrAMINE (BENADRYL) capsule 25 mg (not administered)  metoCLOPramide (REGLAN) injection 10 mg (not administered)     Initial Impression / Assessment and Plan / ED Course  I have reviewed the triage vital signs and the nursing notes.  Pertinent labs & imaging results that were available  during my care of the patient were reviewed by me and considered in my medical decision making (see chart for details).  Clinical Course    2045  On recheck, pt is feeling better, headache resolving and requesting d/c.    Vitals stable,  Pt is non-toxic appearing.  No focal neuro deficits, no nuchal rigidity.  Headache of gradual onset that is similar to previous.  Patient agrees to close f/u with PMD or to return here if the symptoms worsen.   Final Clinical Impressions(s) / ED Diagnoses   Final diagnoses:  Migraine without status migrainosus, not intractable, unspecified migraine type    New Prescriptions New Prescriptions   No medications on file     Rosey Bathammy Honi Name, PA-C 04/04/16 2048    Jacalyn LefevreJulie Haviland, MD 04/04/16 2311

## 2016-06-06 ENCOUNTER — Other Ambulatory Visit: Payer: Self-pay | Admitting: Internal Medicine

## 2016-06-06 DIAGNOSIS — Z1231 Encounter for screening mammogram for malignant neoplasm of breast: Secondary | ICD-10-CM

## 2017-05-29 ENCOUNTER — Other Ambulatory Visit (HOSPITAL_COMMUNITY): Payer: Self-pay | Admitting: Internal Medicine

## 2017-05-29 DIAGNOSIS — Z1231 Encounter for screening mammogram for malignant neoplasm of breast: Secondary | ICD-10-CM

## 2017-06-13 ENCOUNTER — Ambulatory Visit (HOSPITAL_COMMUNITY): Payer: Self-pay

## 2021-11-30 ENCOUNTER — Ambulatory Visit: Payer: Medicaid Other | Admitting: Neurology

## 2021-12-26 ENCOUNTER — Ambulatory Visit: Payer: Medicaid Other | Admitting: Neurology

## 2021-12-26 ENCOUNTER — Encounter: Payer: Self-pay | Admitting: Neurology

## 2021-12-26 VITALS — BP 120/77 | HR 64 | Ht 64.0 in | Wt 157.0 lb

## 2021-12-26 DIAGNOSIS — G629 Polyneuropathy, unspecified: Secondary | ICD-10-CM | POA: Diagnosis not present

## 2021-12-26 DIAGNOSIS — G43009 Migraine without aura, not intractable, without status migrainosus: Secondary | ICD-10-CM | POA: Diagnosis not present

## 2021-12-26 DIAGNOSIS — R269 Unspecified abnormalities of gait and mobility: Secondary | ICD-10-CM

## 2021-12-26 MED ORDER — AMITRIPTYLINE HCL 25 MG PO TABS: 25.0000 mg | ORAL_TABLET | Freq: Every day | ORAL | 3 refills | Status: DC

## 2021-12-26 NOTE — Patient Instructions (Signed)
Neuropathy labs  ?MRI Brain without contrast  ?Referral to physical therapy for gait training  ?Amitriptyline 25 mg nightly for headaches prevention  ?Continue with Tylenol/Motrin as needed for the headaches  ?Return in 3 months  ?

## 2021-12-26 NOTE — Progress Notes (Signed)
? ?[facility] ? ?PATIENT: Deanna Clark ?DOB: 18-Dec-1955 ? ?REQUESTING CLINICIAN: Orlene PlumHoffman, Mackenzie B, NP ?HISTORY FROM: Patient and Son  ?REASON FOR VISIT: Abnormal gait  ? ? ?HISTORICAL ? ?CHIEF COMPLAINT:  ?Chief Complaint  ?Patient presents with  ? abnormal gait  ? ? ?HISTORY OF PRESENT ILLNESS:  ?This is a 66 year old woman past medical history of Migraines headaches, anxiety/Depression, bipolar disorder, hypertension who is presenting for abnormal gait.  Patient reports abnormal gait started since the age of 66, she always felt like she is drunk when walking she had fell due to the abnormal gait but no major injury.  She has never been worked up for abnormal gait.  She reported at 1 time she had a head CT and was told that the head CT was abnormal and she needed to follow-up with MRI but the MRI has never been completed.  She does report a history of joint pain, and also pain with ambulation but denies any claudication type pain.  She is on lisinopril for hypertension and no other medications.   ?For her history of migraines, she reports 3 migraine headaches a week, usually take ibuprofen or Tylenol and the headache will subside to 1 hour later.  She has never been on any preventive medication. ? ?OTHER MEDICAL CONDITIONS: Hypertension   ? ? ?REVIEW OF SYSTEMS: Full 14 system review of systems performed and negative with exception of: as noted in the HPI  ? ?ALLERGIES: ?Allergies  ?Allergen Reactions  ? Hydrocodone Nausea And Vomiting  ? Ibuprofen Nausea And Vomiting  ? Propoxyphene N-Acetaminophen Nausea And Vomiting  ? Robaxin [Methocarbamol] Nausea And Vomiting  ? ? ?HOME MEDICATIONS: ?Outpatient Medications Prior to Visit  ?Medication Sig Dispense Refill  ? lisinopril (ZESTRIL) 10 MG tablet Take 10 mg by mouth daily.    ? cephALEXin (KEFLEX) 500 MG capsule Take 1 capsule (500 mg total) by mouth 4 (four) times daily. For 7 days 28 capsule 0  ? ?No facility-administered medications  prior to visit.  ? ? ?PAST MEDICAL HISTORY: ?Past Medical History:  ?Diagnosis Date  ? Anxiety   ? use to see Psychiatrist   ? Bipolar disorder (HCC)   ? Chronic neck pain   ? Depression   ? previously took paxil in the past - no meds in years   ? GERD (gastroesophageal reflux disease)   ? Hyperlipidemia   ? Hypertension   ? IBS (irritable bowel syndrome)   ? with constipation  ? Low back pain   ? Migraines   ? ? ?PAST SURGICAL HISTORY: ?Past Surgical History:  ?Procedure Laterality Date  ? ABDOMINAL HYSTERECTOMY    ? BACK SURGERY    ? C5-6 fusion  2001  ? Dr. Newell CoralNudelman  ? CHOLECYSTECTOMY  2000  ? SHOULDER SURGERY    ? States she had left shoulder surgery - cant remember when  and [facility]    ? r shoulder surgery  ? TOTAL ABDOMINAL HYSTERECTOMY W/ BILATERAL SALPINGOOPHORECTOMY    ? ? ?FAMILY HISTORY: ?Family History  ?Problem Relation Age of Onset  ? Cancer Mother   ?     lung   ? ? ?SOCIAL HISTORY: ?Social History  ? ?Socioeconomic History  ? Marital status: Single  ?  Spouse name: Not on file  ? Number of children: 2  ? Years of education: Not on file  ? Highest education level: Not on file  ?Occupational History  ? Occupation: Disability   ?  Comment: back pain and headaches   ?Tobacco Use  ? Smoking status: Every Day  ?  Packs/day: 1.00  ?  Types: Cigarettes  ? Smokeless tobacco: Never  ?Substance and Sexual Activity  ? Alcohol use: No  ? Drug use: No  ? Sexual activity: Yes  ?  Birth control/protection: None  ?Other Topics Concern  ? Not on file  ?Social History Narrative  ? Not on file  ? ?Social Determinants of Health  ? ?Financial Resource Strain: Not on file  ?Food Insecurity: Not on file  ?Transportation Needs: Not on file  ?Physical Activity: Not on file  ?Stress: Not on file  ?Social Connections: Not on file  ?Intimate Partner Violence: Not on file  ? ? ?PHYSICAL EXAM ? ?GENERAL EXAM/CONSTITUTIONAL: ?Vitals:  ?Vitals:  ? 12/26/21 1520  ?BP: 120/77  ?Pulse: 64  ?Weight: 157 lb (71.2 kg)   ?Height: 5\' 4"  (1.626 m)  ? ?Body mass index is 26.95 kg/m?. ?Wt Readings from Last 3 Encounters:  ?12/26/21 157 lb (71.2 kg)  ?04/04/16 117 lb (53.1 kg)  ?03/11/16 117 lb 6.4 oz (53.3 kg)  ? ?Patient is in no distress; well developed, nourished and groomed; neck is supple ? ?EYES: ?Pupils round and reactive to light, Visual fields full to confrontation, Extraocular movements intacts,  ? ?MUSCULOSKELETAL: ?Gait, strength, tone, movements noted in Neurologic exam below ? ?NEUROLOGIC: ?MENTAL STATUS:  ?   ? View : No data to display.  ?  ?  ?  ? ?awake, alert, oriented to person, place and time ?recent and remote memory intact ?normal attention and concentration ?language fluent, comprehension intact, naming intact ?fund of knowledge appropriate ? ?CRANIAL NERVE:  ?2nd, 3rd, 4th, 6th - pupils equal and reactive to light, visual fields full to confrontation, extraocular muscles intact, no nystagmus ?5th - facial sensation symmetric ?7th - facial strength symmetric ?8th - hearing intact ?9th - palate elevates symmetrically, uvula midline ?11th - shoulder shrug symmetric ?12th - tongue protrusion midline ? ?MOTOR:  ?normal bulk and tone, full strength in the BUE, BLE except for the bilateral hip flexion 4/5 ? ?SENSORY:  ?Decrease sensation to light touch, pinprick and vibration bilaterally, right worse then left ? ?COORDINATION:  ?finger-nose-finger, fine finger movements normal ? ?REFLEXES:  ?deep tendon reflexes present and symmetric ? ?GAIT/STATION:  ?Wide based, ataxic gait, unable to tandem, positive romberg  ? ? ? ?DIAGNOSTIC DATA (LABS, IMAGING, TESTING) ?- I reviewed patient records, labs, notes, testing and imaging myself where available. ? ?Lab Results  ?Component Value Date  ? WBC 9.5 03/14/2012  ? HGB 15.0 03/14/2012  ? HCT 43.9 03/14/2012  ? MCV 95.9 03/14/2012  ? PLT 178 03/14/2012  ? ?   ?Component Value Date/Time  ? NA 133 (L) 03/14/2012 1650  ? K 3.8 03/14/2012 1650  ? CL 100 03/14/2012 1650  ? CO2 24  03/14/2012 1650  ? GLUCOSE 92 03/14/2012 1650  ? BUN 12 03/14/2012 1650  ? CREATININE 0.63 03/14/2012 1650  ? CALCIUM 9.1 03/14/2012 1650  ? PROT 7.1 03/14/2012 1650  ? ALBUMIN 3.8 03/14/2012 1650  ? AST 15 03/14/2012 1650  ? ALT 12 03/14/2012 1650  ? ALKPHOS 67 03/14/2012 1650  ? BILITOT 0.2 (L) 03/14/2012 1650  ? GFRNONAA >90 03/14/2012 1650  ? GFRAA >90 03/14/2012 1650  ? ?No results found for: CHOL, HDL, LDLCALC, LDLDIRECT, TRIG, CHOLHDL ?No results found for: HGBA1C ?No results found for: VITAMINB12 ?Lab Results  ?Component Value Date  ? TSH 0.497 01/07/2010  ? ? ?  Head CT 2010 ?Hypoattenuation in the right frontal lobe with a smaller focus of  hypoattenuation in the left frontal lobe may represent infarcts,  age indeterminate.  Brain MRI with and without contrast would be useful for further evaluation ? ? ? ? ?ASSESSMENT AND PLAN ? ?66 y.o. year old female with history of anxiety depression, bipolar disorder, hypertension who is presenting with history of gait abnormality for the past 40 years.  Patient described her gait abnormality as feeling drunk, she has never been worked up for it.  On exam she has evidence of neuropathy as decreased sensation to light touch pinprick and vibratory right worse than left and a wide ataxic gait.  I will start by obtaining neuropathy lab and also a MRI of her brain because of her history of abnormal CT scan.  Depending on neuropathy results, I will consider obtaining EMG nerve conduction study.  I will also send the patient to physical therapy for gait training. Follow up in 3 months  ?In terms of her migraines, I will start her on amitriptyline as preventive medication as patient also reports difficulty sleeping.  She can continue with Tylenol and ibuprofen as needed for the headache since there is good results.  ? ? ?1. Neuropathy   ?2. Abnormal gait   ?3. Migraine without aura and without status migrainosus, not intractable   ? ? ? ?Patient Instructions  ?Neuropathy labs   ?MRI Brain without contrast  ?Referral to physical therapy for gait training  ?Amitriptyline 25 mg nightly for headaches prevention  ?Continue with Tylenol/Motrin as needed for the headaches  ?Return in 3

## 2021-12-27 ENCOUNTER — Telehealth: Payer: Self-pay | Admitting: Neurology

## 2021-12-27 NOTE — Telephone Encounter (Signed)
[employer] medicaid Berkley Harvey: [id number] exp. 12/27/21-02/10/22 sent to GI they will reach out to schedule. ?

## 2021-12-29 LAB — CBC WITH DIFFERENTIAL/PLATELET
Basophils Absolute: 0 10*3/uL (ref 0.0–0.2)
Basos: 1 %
EOS (ABSOLUTE): 0.2 10*3/uL (ref 0.0–0.4)
Eos: 3 %
Hematocrit: 40.4 % (ref 34.0–46.6)
Hemoglobin: 13.8 g/dL (ref 11.1–15.9)
Immature Grans (Abs): 0 10*3/uL (ref 0.0–0.1)
Immature Granulocytes: 0 %
Lymphocytes Absolute: 2.4 10*3/uL (ref 0.7–3.1)
Lymphs: 38 %
MCH: 32.9 pg (ref 26.6–33.0)
MCHC: 34.2 g/dL (ref 31.5–35.7)
MCV: 96 fL (ref 79–97)
Monocytes Absolute: 0.5 10*3/uL (ref 0.1–0.9)
Monocytes: 8 %
Neutrophils Absolute: 3.2 10*3/uL (ref 1.4–7.0)
Neutrophils: 50 %
Platelets: 208 10*3/uL (ref 150–450)
RBC: 4.2 x10E6/uL (ref 3.77–5.28)
RDW: 12.1 % (ref 11.7–15.4)
WBC: 6.3 10*3/uL (ref 3.4–10.8)

## 2021-12-29 LAB — COMPREHENSIVE METABOLIC PANEL
ALT: 16 IU/L (ref 0–32)
AST: 21 IU/L (ref 0–40)
Albumin/Globulin Ratio: 1.9 (ref 1.2–2.2)
Albumin: 4.4 g/dL (ref 3.8–4.8)
Alkaline Phosphatase: 68 IU/L (ref 44–121)
BUN/Creatinine Ratio: 18 (ref 12–28)
BUN: 12 mg/dL (ref 8–27)
Bilirubin Total: 0.3 mg/dL (ref 0.0–1.2)
CO2: 24 mmol/L (ref 20–29)
Calcium: 9.4 mg/dL (ref 8.7–10.3)
Chloride: 103 mmol/L (ref 96–106)
Creatinine, Ser: 0.67 mg/dL (ref 0.57–1.00)
Globulin, Total: 2.3 g/dL (ref 1.5–4.5)
Glucose: 88 mg/dL (ref 70–99)
Potassium: 4.2 mmol/L (ref 3.5–5.2)
Sodium: 141 mmol/L (ref 134–144)
Total Protein: 6.7 g/dL (ref 6.0–8.5)
eGFR: 98 mL/min/{1.73_m2} (ref >59)

## 2021-12-29 LAB — TSH: TSH: 0.654 u[IU]/mL (ref 0.450–4.500)

## 2021-12-29 LAB — MULTIPLE MYELOMA PANEL, SERUM
Albumin SerPl Elph-Mcnc: 3.9 g/dL (ref 2.9–4.4)
Albumin/Glob SerPl: 1.4 (ref 0.7–1.7)
Alpha 1: 0.3 g/dL (ref 0.0–0.4)
Alpha2 Glob SerPl Elph-Mcnc: 0.7 g/dL (ref 0.4–1.0)
B-Globulin SerPl Elph-Mcnc: 1 g/dL (ref 0.7–1.3)
Gamma Glob SerPl Elph-Mcnc: 0.8 g/dL (ref 0.4–1.8)
Globulin, Total: 2.8 g/dL (ref 2.2–3.9)
IgA/Immunoglobulin A, Serum: 334 mg/dL (ref 87–352)
IgG (Immunoglobin G), Serum: 844 mg/dL (ref 586–1602)
IgM (Immunoglobulin M), Srm: 53 mg/dL (ref 26–217)

## 2021-12-29 LAB — HEPATITIS C ANTIBODY: Hep C Virus Ab: NONREACTIVE

## 2021-12-29 LAB — HEPATITIS B CORE ANTIBODY, TOTAL: Hep B Core Total Ab: NEGATIVE

## 2021-12-29 LAB — C-REACTIVE PROTEIN: CRP: <1 mg/L (ref 0–10)

## 2021-12-29 LAB — HEMOGLOBIN A1C
Est. average glucose Bld gHb Est-mCnc: 111 mg/dL
Hgb A1c MFr Bld: 5.5 % (ref 4.8–5.6)

## 2021-12-29 LAB — HOMOCYSTEINE: Homocysteine: 9.9 umol/L (ref 0.0–17.2)

## 2021-12-29 LAB — RPR: RPR Ser Ql: NONREACTIVE

## 2021-12-29 LAB — HEPATITIS B SURFACE ANTIGEN: Hepatitis B Surface Ag: NEGATIVE

## 2021-12-29 LAB — METHYLMALONIC ACID, SERUM: Methylmalonic Acid: 109 nmol/L (ref 0–378)

## 2021-12-29 LAB — VITAMIN B12: Vitamin B-12: 333 pg/mL (ref 232–1245)

## 2021-12-29 LAB — SJOGREN'S SYNDROME ANTIBODS(SSA + SSB)
ENA SSA (RO) Ab: <0.2 AI (ref 0.0–0.9)
ENA SSB (LA) Ab: <0.2 AI (ref 0.0–0.9)

## 2021-12-29 LAB — ANA W/REFLEX: ANA Titer 1: NEGATIVE

## 2021-12-29 LAB — SEDIMENTATION RATE: Sed Rate: 3 mm/hr (ref 0–40)

## 2021-12-29 LAB — COPPER, SERUM: Copper: 133 ug/dL (ref 80–158)

## 2021-12-29 NOTE — Progress Notes (Signed)
Please call and advise the patient that the recent labs we checked were within normal limits. We checked  vitamin B12 level, cell count, blood sugar, diabetes marker, electrolytes, kidney function, liver function, thyroid function, inflammatory markers, sedimentation rate. No further action is required on these tests at this time. Please remind patient to keep any upcoming appointments or tests and to call us with any interim questions, concerns, problems or updates. Thanks,   Levorn Oleski, MD  

## 2022-01-02 ENCOUNTER — Telehealth: Payer: Self-pay | Admitting: *Deleted

## 2022-01-02 NOTE — Telephone Encounter (Signed)
DPR scanned to chart. Spoke to her son and provided the lab results. He will relay the information to the patient and have her call us back with any questions. ?

## 2022-01-02 NOTE — Telephone Encounter (Signed)
I called the patient's phone number. Family answered, but we have no DPR on file for this patient. Asked for them to relay message for call back. ?

## 2022-01-02 NOTE — Telephone Encounter (Signed)
-----   Message from Windell Norfolk, MD sent at 12/29/2021  5:39 PM EDT ----- ?Please call and advise the patient that the recent labs we checked were within normal limits. We checked  vitamin B12 level, cell count, blood sugar, diabetes marker, electrolytes, kidney function, liver function, thyroid function, inflammatory markers, sedimentation rate. No further action is required on these tests at this time. Please remind patient to keep any upcoming appointments or tests and to call us with any interim questions, concerns, problems or updates. Thanks,  ? ?Windell Norfolk, MD ? ? ? ?

## 2022-01-09 ENCOUNTER — Ambulatory Visit
Admission: RE | Admit: 2022-01-09 | Discharge: 2022-01-09 | Disposition: A | Discharge status: Home / Self Care | Payer: Medicaid Other | Source: Ambulatory Visit | Attending: Neurology | Admitting: Neurology

## 2022-01-09 DIAGNOSIS — R269 Unspecified abnormalities of gait and mobility: Secondary | ICD-10-CM

## 2022-01-09 DIAGNOSIS — G629 Polyneuropathy, unspecified: Secondary | ICD-10-CM

## 2022-01-11 NOTE — Progress Notes (Signed)
Spoke with son Montine Circle, discuss MRI results showing  1.   Focus of T2/FLAIR hyperintensity in the right frontal lobe most consistent with the sequela of previous head trauma.  This appears similar to changes observed on the 2010 CT scan. 2.   Few scattered T2/FLAIR hyperintense foci elsewhere in the hemispheres consistent with minimal chronic microvascular ischemic changes, common for age. 3.   No acute findings  Explain to him that this finding is old and likely not causing her gait abnormality. He voices understanding. They have not started PT yet due to logistic (transport).

## 2022-04-03 ENCOUNTER — Ambulatory Visit: Payer: Medicaid Other | Admitting: Neurology

## 2022-04-03 ENCOUNTER — Encounter: Payer: Self-pay | Admitting: Neurology

## 2022-04-19 ENCOUNTER — Ambulatory Visit (INDEPENDENT_AMBULATORY_CARE_PROVIDER_SITE_OTHER): Payer: Medicaid Other | Admitting: Neurology

## 2022-04-19 ENCOUNTER — Encounter: Payer: Self-pay | Admitting: Neurology

## 2022-04-19 VITALS — BP 143/93 | HR 83 | Ht 65.0 in | Wt 159.5 lb

## 2022-04-19 DIAGNOSIS — G629 Polyneuropathy, unspecified: Secondary | ICD-10-CM | POA: Diagnosis not present

## 2022-04-19 DIAGNOSIS — R269 Unspecified abnormalities of gait and mobility: Secondary | ICD-10-CM | POA: Diagnosis not present

## 2022-04-19 DIAGNOSIS — G43009 Migraine without aura, not intractable, without status migrainosus: Secondary | ICD-10-CM | POA: Diagnosis not present

## 2022-04-19 MED ORDER — SUMATRIPTAN SUCCINATE 100 MG PO TABS: 100.0000 mg | ORAL_TABLET | Freq: Once | ORAL | 2 refills | Status: AC | PRN

## 2022-04-19 MED ORDER — PREGABALIN 50 MG PO CAPS: 50.0000 mg | ORAL_CAPSULE | Freq: Two times a day (BID) | ORAL | 6 refills | Status: AC

## 2022-04-19 NOTE — Progress Notes (Signed)
[facility]  PATIENT: Deanna Clark DOB: [birth year]  REQUESTING CLINICIAN: Orlene Plum, NP HISTORY FROM: Patient and Son  REASON FOR VISIT: Abnormal gait    HISTORICAL  CHIEF COMPLAINT:  Chief Complaint  Patient presents with   Follow-up    Rm 12. Alone. C/o worsening neuropathy and chronic morning headache. D/c amitriptyline.   INTERVAL HISTORY 04/19/22 Patient presents today for follow-up, at last visit in May plan was to obtain neuropathy lab, did not show any etiology for her neuropathy.  Her brain MRI also did not show any etiology for her abnormal gait.  For her ongoing headaches I started her on amitriptyline but she discontinued this medication a few weeks later because she felt it was not helpful.  She is presenting today still having gait abnormality, on top of that she states she dislocated her right shoulder, the shoulder popped out of place but she was able to put it back but did not go to the emergency department and did not call her primary care doctor.  Report now right arm is limited by pain.   HISTORY OF PRESENT ILLNESS:  This is a 66 year old woman past medical history of Migraines headaches, anxiety/Depression, bipolar disorder, hypertension who is presenting for abnormal gait.  Patient reports abnormal gait started since the age of 66, she always felt like she is drunk when walking she had fell due to the abnormal gait but no major injury.  She has never been worked up for abnormal gait.  She reported at 1 time she had a head CT and was told that the head CT was abnormal and she needed to follow-up with MRI but the MRI has never been completed.  She does report a history of joint pain, and also pain with ambulation but denies any claudication type pain.  She is on lisinopril for hypertension and no other medications.   For her history of migraines, she reports 3 migraine headaches a week, usually take ibuprofen or Tylenol and the  headache will subside to 1 hour later.  She has never been on any preventive medication.  OTHER MEDICAL CONDITIONS: Hypertension     REVIEW OF SYSTEMS: Full 14 system review of systems performed and negative with exception of: as noted in the HPI   ALLERGIES: Allergies  Allergen Reactions   Hydrocodone Nausea And Vomiting   Ibuprofen Nausea And Vomiting   Propoxyphene N-Acetaminophen Nausea And Vomiting   Robaxin [Methocarbamol] Nausea And Vomiting    HOME MEDICATIONS: Outpatient Medications Prior to Visit  Medication Sig Dispense Refill   lisinopril (ZESTRIL) 10 MG tablet Take 10 mg by mouth daily. (Patient not taking: Reported on 04/19/2022)     amitriptyline (ELAVIL) 25 MG tablet Take 1 tablet (25 mg total) by mouth at bedtime. (Patient not taking: Reported on 04/19/2022) 30 tablet 3   No facility-administered medications prior to visit.    PAST MEDICAL HISTORY: Past Medical History:  Diagnosis Date   Anxiety    use to see Psychiatrist    Bipolar disorder (HCC)    Chronic neck pain    Depression    previously took paxil in the past - no meds in years    GERD (gastroesophageal reflux disease)    Hyperlipidemia    Hypertension    IBS (irritable bowel syndrome)    with constipation   Low back pain    Migraines     PAST SURGICAL HISTORY: Past Surgical History:  Procedure Laterality Date   ABDOMINAL HYSTERECTOMY  BACK SURGERY     C5-6 fusion  2001   [name]   CHOLECYSTECTOMY  2000   SHOULDER SURGERY     States she had left shoulder surgery - cant remember when  and no record in [facility]     r shoulder surgery   TOTAL ABDOMINAL HYSTERECTOMY W/ BILATERAL SALPINGOOPHORECTOMY      FAMILY HISTORY: Family History  Problem Relation Age of Onset   Cancer Mother        lung     SOCIAL HISTORY: Social History   Socioeconomic History   Marital status: Single    Spouse name: Not on file   Number of children: 2   Years of education: Not on file    Highest education level: Not on file  Occupational History   Occupation: Disability     Comment: back pain and headaches   Tobacco Use   Smoking status: Every Day    Packs/day: 1.00    Types: Cigarettes   Smokeless tobacco: Never  Substance and Sexual Activity   Alcohol use: No   Drug use: No   Sexual activity: Yes    Birth control/protection: None  Other Topics Concern   Not on file  Social History Narrative   Not on file   Social Determinants of Health   Financial Resource Strain: Not on file  Food Insecurity: Not on file  Transportation Needs: Not on file  Physical Activity: Not on file  Stress: Not on file  Social Connections: Not on file  Intimate Partner Violence: Not on file    PHYSICAL EXAM  GENERAL EXAM/CONSTITUTIONAL: Vitals:  Vitals:   04/19/22 1125  BP: (!) 143/93  Pulse: 83  Weight: 159 lb 8 oz (72.3 kg)  Height: 5\' 5"  (1.651 m)   Body mass index is 26.54 kg/m. Wt Readings from Last 3 Encounters:  04/19/22 159 lb 8 oz (72.3 kg)  12/26/21 157 lb (71.2 kg)  04/04/16 117 lb (53.1 kg)   Patient is in no distress; well developed, nourished and groomed; neck is supple  EYES: Pupils round and reactive to light, Visual fields full to confrontation, Extraocular movements intacts,   MUSCULOSKELETAL: Gait, strength, tone, movements noted in Neurologic exam below  NEUROLOGIC: MENTAL STATUS:      No data to display         awake, alert, oriented to person, place and time recent and remote memory intact normal attention and concentration language fluent, comprehension intact, naming intact fund of knowledge appropriate  CRANIAL NERVE:  2nd, 3rd, 4th, 6th - pupils equal and reactive to light, visual fields full to confrontation, extraocular muscles intact, no nystagmus 5th - facial sensation symmetric 7th - facial strength symmetric 8th - hearing intact 9th - palate elevates symmetrically, uvula midline 11th - shoulder shrug symmetric 12th -  tongue protrusion midline  MOTOR:  normal bulk and tone, full strength throughout  except for the bilateral hip flexion 4/5. RUE Extremity is limited by pain.   SENSORY:  Decrease sensation to light touch, pinprick and vibration bilaterally, right worse then left  COORDINATION:  finger-nose-finger, fine finger movements normal  REFLEXES:  deep tendon reflexes present and symmetric  GAIT/STATION:  Wide based, ataxic gait, unable to tandem, positive romberg     DIAGNOSTIC DATA (LABS, IMAGING, TESTING) - I reviewed patient records, labs, notes, testing and imaging myself where available.  Lab Results  Component Value Date   WBC 6.3 12/26/2021   HGB 13.8 12/26/2021   HCT 40.4  12/26/2021   MCV 96 12/26/2021   PLT 208 12/26/2021      Component Value Date/Time   NA 141 12/26/2021 1556   K 4.2 12/26/2021 1556   CL 103 12/26/2021 1556   CO2 24 12/26/2021 1556   GLUCOSE 88 12/26/2021 1556   GLUCOSE 92 03/14/2012 1650   BUN 12 12/26/2021 1556   CREATININE 0.67 12/26/2021 1556   CALCIUM 9.4 12/26/2021 1556   PROT 6.7 12/26/2021 1556   ALBUMIN 4.4 12/26/2021 1556   AST 21 12/26/2021 1556   ALT 16 12/26/2021 1556   ALKPHOS 68 12/26/2021 1556   BILITOT 0.3 12/26/2021 1556   GFRNONAA >90 03/14/2012 1650   GFRAA >90 03/14/2012 1650   No results found for: "CHOL", "HDL", "LDLCALC", "LDLDIRECT", "TRIG", "CHOLHDL" Lab Results  Component Value Date   HGBA1C 5.5 12/26/2021   Lab Results  Component Value Date   VITAMINB12 333 12/26/2021   Lab Results  Component Value Date   TSH 0.654 12/26/2021    Head CT 2010 Hypoattenuation in the right frontal lobe with a smaller focus of  hypoattenuation in the left frontal lobe may represent infarcts,  age indeterminate.  Brain MRI with and without contrast would be useful for further evaluation  MRI Brain 01/09/22 1. Focus of T2/FLAIR hyperintensity in the right frontal lobe most consistent with the sequela of previous head trauma.  This appears similar to changes observed on the 2010 CT scan. 2. Few scattered T2/FLAIR hyperintense foci elsewhere in the hemispheres consistent with minimal chronic microvascular ischemic changes, common for age. 3. No acute findings.    ASSESSMENT AND PLAN  66 y.o. year old female with history of anxiety depression, bipolar disorder, hypertension who is presenting for follow up for gait abnormality for the past 40 year and migraines.  Her neuropathy lab was unrevealing, and MRI brain were also unrevealing.  I will start her on pregabalin 50 mg twice daily for pain and uptitrate as needed.  When it comes to the headaches, she tried amitriptyline but self discontinued because it was not helpful.  For now, I will give her sumatriptan to use for the headaches.  I did advise patient to follow-up with her primary care doctor for evaluation of the right shoulder pain and dislocation.  She voices understanding.  Continue to follow with your PCP and return as needed.    1. Neuropathy   2. Abnormal gait   3. Migraine without aura and without status migrainosus, not intractable       Patient Instructions  Start with Pregabalin 50 mg twice daily Sumatriptan as needed for headaches  Follow up with your primary care physician Sandrea Matte [phone] Return as needed   No orders of the defined types were placed in this encounter.   Meds ordered this encounter  Medications   pregabalin (LYRICA) 50 MG capsule    Sig: Take 1 capsule (50 mg total) by mouth 2 (two) times daily.    Dispense:  60 capsule    Refill:  6   SUMAtriptan (IMITREX) 100 MG tablet    Sig: Take 1 tablet (100 mg total) by mouth once as needed for up to 1 dose for migraine. May repeat in 2 hours if headache persists or recurs.    Dispense:  10 tablet    Refill:  2    Return if symptoms worsen or fail to improve.  I have spent a total of 30 minutes dedicated to this patient today, preparing to see  patient,  performing a medically appropriate examination and evaluation, ordering tests and/or medications and procedures, and counseling and educating the patient/family/caregiver; independently interpreting result and communicating results to the family/patient/caregiver; and documenting clinical information in the electronic medical record.    Windell Norfolk, MD 04/19/2022, 11:50 AM  [facility] [address] [place], [place] [address] [phone]

## 2022-04-19 NOTE — Patient Instructions (Addendum)
Start with Pregabalin 50 mg twice daily Sumatriptan as needed for headaches  Follow up with your primary care physician Sandrea Matte [phone] for evaluation of right shoulder pain/dislocation Return as needed

## 2022-05-08 ENCOUNTER — Emergency Department (HOSPITAL_COMMUNITY): Payer: Medicaid Other

## 2022-05-08 ENCOUNTER — Encounter (HOSPITAL_COMMUNITY): Payer: Self-pay

## 2022-05-08 ENCOUNTER — Other Ambulatory Visit: Payer: Self-pay

## 2022-05-08 ENCOUNTER — Emergency Department (HOSPITAL_COMMUNITY)
Admission: EM | Admit: 2022-05-08 | Discharge: 2022-05-08 | Disposition: A | Discharge status: Home / Self Care | Payer: Medicaid Other | Attending: Emergency Medicine | Admitting: Emergency Medicine

## 2022-05-08 DIAGNOSIS — M19011 Primary osteoarthritis, right shoulder: Secondary | ICD-10-CM | POA: Insufficient documentation

## 2022-05-08 DIAGNOSIS — W19XXXA Unspecified fall, initial encounter: Secondary | ICD-10-CM | POA: Insufficient documentation

## 2022-05-08 DIAGNOSIS — I10 Essential (primary) hypertension: Secondary | ICD-10-CM | POA: Diagnosis not present

## 2022-05-08 DIAGNOSIS — Y9389 Activity, other specified: Secondary | ICD-10-CM | POA: Diagnosis not present

## 2022-05-08 DIAGNOSIS — M25562 Pain in left knee: Secondary | ICD-10-CM | POA: Diagnosis not present

## 2022-05-08 DIAGNOSIS — S9031XD Contusion of right foot, subsequent encounter: Secondary | ICD-10-CM

## 2022-05-08 DIAGNOSIS — S99921A Unspecified injury of right foot, initial encounter: Secondary | ICD-10-CM | POA: Diagnosis present

## 2022-05-08 DIAGNOSIS — Z79899 Other long term (current) drug therapy: Secondary | ICD-10-CM | POA: Insufficient documentation

## 2022-05-08 DIAGNOSIS — G8929 Other chronic pain: Secondary | ICD-10-CM

## 2022-05-08 DIAGNOSIS — Y92000 Kitchen of unspecified non-institutional (private) residence as  the place of occurrence of the external cause: Secondary | ICD-10-CM | POA: Diagnosis not present

## 2022-05-08 DIAGNOSIS — S9031XA Contusion of right foot, initial encounter: Secondary | ICD-10-CM | POA: Diagnosis not present

## 2022-05-08 MED ORDER — NAPROXEN 500 MG PO TABS: 500.0000 mg | ORAL_TABLET | Freq: Two times a day (BID) | ORAL | 0 refills | Status: AC

## 2022-05-08 MED ORDER — OXYCODONE HCL 5 MG PO TABS
5.0000 mg | ORAL_TABLET | Freq: Once | ORAL | Status: AC
  Administered 2022-05-08: 5 mg via ORAL
  Filled 2022-05-08: qty 1

## 2022-05-08 NOTE — ED Notes (Signed)
Spoke with [facility] made transportation arrangements for patient to be transported home. Patient  notified.

## 2022-05-08 NOTE — Discharge Instructions (Addendum)
You have been seen in the Emergency Department (ED) today following a fall.  Your workup today did not reveal any injuries that require you to stay in the hospital. You can expect to be stiff and sore for the next several days.  Please take Tylenol and Naproxen as prescribed as needed for pain, but only as written on the box.  Please follow up with your primary care doctor as soon as possible regarding today's ED visit and your recent accident. Call the orthopedic surgeon number for follow up in outpatient setting. There is arthritis in your right shoulder and no fractures or dislocation seen on your imaging today.   Call your doctor or return to the ED if you develop a sudden or severe headache, confusion, slurred speech, facial droop, weakness or numbness in any arm or leg,  extreme fatigue, vomiting more than two times, severe abdominal pain, difficulty breathing or any other concerning signs or symptoms.

## 2022-05-08 NOTE — ED Triage Notes (Signed)
Patient present to ED via [facility], c/o pain in left leg. Reports she fell last night in her kitchen after trying to pulling her door open. Denies  hitting her head, no LOC. C/o pain in right shoulder and right foot.

## 2022-05-08 NOTE — ED Notes (Signed)
Patient transported to x-ray. ?

## 2022-05-08 NOTE — ED Provider Notes (Signed)
[facility] EMERGENCY DEPARTMENT Provider Note   CSN: [id number] Arrival date & time: 05/08/22  1594     History  Chief Complaint  Patient presents with   Leg Pain    Deanna Clark is a 66 y.o. female. With pmh HTN, HLD, anxiety, depression BIB EMS from home left leg pain after falling last night trying to open a large door and falling onto her left hip.  She also complains of chronic pain and bruising in her right foot from previous falls and injuries as well as chronic pain and clicking in her right shoulder.  No numbness, tingling, loss of sensation.  She has been unable to bear weight on her left leg since the fall.  She denies any preceding symptoms leading to the fall.  She denies any head injury or loss of consciousness.  She denies being on anticoagulation.   Leg Pain      Home Medications Prior to Admission medications   Medication Sig Start Date End Date Taking? Authorizing Provider  naproxen (NAPROSYN) 500 MG tablet Take 1 tablet (500 mg total) by mouth 2 (two) times daily. 05/08/22  Yes Mardene Sayer, MD  lisinopril (ZESTRIL) 10 MG tablet Take 10 mg by mouth daily. Patient not taking: Reported on 04/19/2022 10/10/21   [provider]  pregabalin (LYRICA) 50 MG capsule Take 1 capsule (50 mg total) by mouth 2 (two) times daily. 04/19/22 11/15/22  Windell Norfolk, MD  SUMAtriptan (IMITREX) 100 MG tablet Take 1 tablet (100 mg total) by mouth once as needed for up to 1 dose for migraine. May repeat in 2 hours if headache persists or recurs. 04/19/22   Windell Norfolk, MD      Allergies    Hydrocodone, Ibuprofen, Propoxyphene n-acetaminophen, and Robaxin [methocarbamol]    Review of Systems   Review of Systems  Physical Exam Updated Vital Signs BP (!) 144/93 (BP Location: Right Arm)   Pulse 75   Temp 98.3 F (36.8 C) (Oral)   Resp 16   Ht 5\' 5"  (1.651 m)   Wt 68 kg   SpO2 95%   BMI 24.96 kg/m  Physical Exam Constitutional: Alert and oriented.  Chronically ill in appearance but no acute distress. Eyes: Conjunctivae are normal. ENT      Head: Normocephalic and atraumatic.      Nose: No congestion.      Mouth/Throat: Mucous membranes are moist.      Neck: No stridor. No midline ttp, stepoff or deformity. Cardiovascular: S1, S2,  Normal and symmetric distal pulses are present in all extremities.Warm and well perfused. Respiratory: Normal respiratory effort. Breath sounds are normal. O2 sat 95 on RA. No chest wall ttp. Gastrointestinal: Soft and nontender. Musculoskeletal:  pain with abduction right shoulder past 90 degrees, no external evidence of injury, distal pulses and sensation intact      Right lower leg: No deformity. Right dorsal midfoot  old appearing contusion with ttp, wiggling toes, sensation intact, palpable right dp pulse      Left lower leg: TTP to left lateral knee and upper fibula, no bruising, laceration, abrasion or effusion. Tenderness with knee extrension. No pain proximal or distal to left knee. No bony deformity. Distal sensation intact. No pain with logroll. DP pulse palpable. Neurologic: Normal speech and language. No gross focal neurologic deficits are appreciated. Skin: Skin is warm, dry and intact. No rash noted. Psychiatric: Mood and affect are normal. Speech and behavior are normal.  ED Results / Procedures / Treatments  Labs (all labs ordered are listed, but only abnormal results are displayed) Labs Reviewed - No data to display  EKG None  Radiology DG Knee Complete 4 Views Left  Result Date: 05/08/2022 CLINICAL DATA:  Fall, left knee pain. EXAM: LEFT KNEE - COMPLETE 4+ VIEW COMPARISON:  None Available. FINDINGS: No evidence of fracture, dislocation, or joint effusion. No evidence of arthropathy or other focal bone abnormality. Vascular calcification. IMPRESSION: No acute fracture or dislocation. Electronically Signed   By: Larose Hires D.O.   On: 05/08/2022 09:56   DG HIP UNILAT WITH PELVIS 2-3  VIEWS LEFT  Result Date: 05/08/2022 CLINICAL DATA:  Left hip and left knee pain.  Fall last night. EXAM: DG HIP (WITH OR WITHOUT PELVIS) 2-3V LEFT COMPARISON:  None Available. FINDINGS: There is no evidence of hip fracture or dislocation. There is no evidence of arthropathy or other focal bone abnormality. IMPRESSION: Negative. Electronically Signed   By: Larose Hires D.O.   On: 05/08/2022 09:53   DG Shoulder Right  Result Date: 05/08/2022 CLINICAL DATA:  Fall, shoulder pain EXAM: RIGHT SHOULDER - 2+ VIEW COMPARISON:  None Available. FINDINGS: There is no evidence of fracture or dislocation. Glenohumeral joint space narrowing with humeral head and neck junction osteophytes. High-riding humeral head suggesting rotator cuff arthropathy. Soft tissues are unremarkable. IMPRESSION: 1. No acute fracture or dislocation. 2. Mild glenohumeral osteoarthritis. 3. High-riding humeral head concerning for rotator cuff arthropathy. Electronically Signed   By: Larose Hires D.O.   On: 05/08/2022 09:52   DG Foot Complete Right  Result Date: 05/08/2022 CLINICAL DATA:  Fall, pain. EXAM: RIGHT FOOT COMPLETE - 3+ VIEW COMPARISON:  None Available. FINDINGS: There is no evidence of fracture or dislocation. Mild arthritic changes at the first metatarsophalangeal and interphalangeal joints. Os navicularis incidentally noted. Soft tissues are unremarkable. IMPRESSION: No acute fracture or dislocation. Electronically Signed   By: Larose Hires D.O.   On: 05/08/2022 09:48    Procedures Procedures    Medications Ordered in ED Medications  oxyCODONE (Oxy IR/ROXICODONE) immediate release tablet 5 mg (5 mg Oral Given 05/08/22 0946)    ED Course/ Medical Decision Making/ A&P                           Medical Decision Making Deanna Clark is a 66 y.o. female. With pmh HTN, HLD, anxiety, depression BIB EMS from home left leg pain after falling last night trying to open a large door and falling onto her left hip.  She also  complains of chronic pain and bruising in her right foot from previous falls and injuries as well as chronic pain and clicking in her right shoulder.  Patient with nonsyncopal mechanical fall. No requirement for labs at this time. No head injury and not on AC, no requirement for head imaging. She has pain in left knee and XR obtained with no evidence of injury. Chronic right shoulder pain suggestive of OA and likely chronic rotator cuff injury, c/w XR which shows no fx or dislocation. Right foot with chronic older bruise, no fracture or dislocation on imaging.  Patient provided with oxycodone, knee brace and crutches in ED. Discharging with prn naproxen and advising RICE method and orthopedic info for outpatient follow up. Safe for discharge with return precautions.  Amount and/or Complexity of Data Reviewed Radiology: ordered and independent interpretation performed.    Details: Personal interpretation no acute fx on knee image  Risk Prescription drug management.  Final Clinical Impression(s) / ED Diagnoses Final diagnoses:  Chronic right shoulder pain  Osteoarthritis of right glenohumeral joint  Acute pain of left knee  Contusion of right foot, subsequent encounter    Rx / DC Orders ED Discharge Orders          Ordered    naproxen (NAPROSYN) 500 MG tablet  2 times daily        05/08/22 1051              Elgie Congo, MD 05/08/22 1052
